# Patient Record
Sex: Male | Born: 1950
Health system: Southern US, Community
[De-identification: ages and names within clinical notes are randomized; demographics above are authoritative.]

## PROBLEM LIST (undated history)

## (undated) DIAGNOSIS — I10 Essential (primary) hypertension: Secondary | ICD-10-CM

## (undated) DIAGNOSIS — M109 Gout, unspecified: Secondary | ICD-10-CM

## (undated) DIAGNOSIS — Z973 Presence of spectacles and contact lenses: Secondary | ICD-10-CM

## (undated) DIAGNOSIS — G8929 Other chronic pain: Secondary | ICD-10-CM

## (undated) DIAGNOSIS — E785 Hyperlipidemia, unspecified: Secondary | ICD-10-CM

## (undated) DIAGNOSIS — M549 Dorsalgia, unspecified: Secondary | ICD-10-CM

## (undated) DIAGNOSIS — G629 Polyneuropathy, unspecified: Secondary | ICD-10-CM

## (undated) DIAGNOSIS — M1990 Unspecified osteoarthritis, unspecified site: Secondary | ICD-10-CM

## (undated) DIAGNOSIS — E119 Type 2 diabetes mellitus without complications: Secondary | ICD-10-CM

## (undated) DIAGNOSIS — G2581 Restless legs syndrome: Secondary | ICD-10-CM

## (undated) HISTORY — PX: TONSILLECTOMY: SUR1361

## (undated) HISTORY — PX: ELBOW ARTHROSCOPY: SUR87

## (undated) HISTORY — PX: SHOULDER ARTHROSCOPY: SHX128

## (undated) HISTORY — PX: CARPAL TUNNEL RELEASE: SHX101

## (undated) HISTORY — PX: COLONOSCOPY: SHX174

## (undated) HISTORY — PX: OTHER SURGICAL HISTORY: SHX169

---

## 2011-11-07 ENCOUNTER — Ambulatory Visit: Payer: Managed Care, Other (non HMO) | Attending: Orthopedic Surgery | Admitting: Physical Therapy

## 2011-11-07 DIAGNOSIS — M25673 Stiffness of unspecified ankle, not elsewhere classified: Secondary | ICD-10-CM | POA: Insufficient documentation

## 2011-11-07 DIAGNOSIS — M25579 Pain in unspecified ankle and joints of unspecified foot: Secondary | ICD-10-CM | POA: Insufficient documentation

## 2011-11-07 DIAGNOSIS — R5381 Other malaise: Secondary | ICD-10-CM | POA: Insufficient documentation

## 2011-11-07 DIAGNOSIS — IMO0001 Reserved for inherently not codable concepts without codable children: Secondary | ICD-10-CM | POA: Insufficient documentation

## 2011-11-07 DIAGNOSIS — M25676 Stiffness of unspecified foot, not elsewhere classified: Secondary | ICD-10-CM | POA: Insufficient documentation

## 2011-11-14 ENCOUNTER — Encounter: Payer: Managed Care, Other (non HMO) | Admitting: *Deleted

## 2011-11-26 ENCOUNTER — Ambulatory Visit: Payer: Managed Care, Other (non HMO) | Admitting: Physical Therapy

## 2012-03-09 ENCOUNTER — Encounter (HOSPITAL_BASED_OUTPATIENT_CLINIC_OR_DEPARTMENT_OTHER): Payer: Self-pay | Admitting: *Deleted

## 2012-03-10 ENCOUNTER — Encounter (HOSPITAL_BASED_OUTPATIENT_CLINIC_OR_DEPARTMENT_OTHER)
Admission: RE | Admit: 2012-03-10 | Discharge: 2012-03-10 | Disposition: A | Discharge status: Home / Self Care | Payer: Managed Care, Other (non HMO) | Source: Ambulatory Visit | Attending: Orthopedic Surgery | Admitting: Orthopedic Surgery

## 2012-03-10 LAB — BASIC METABOLIC PANEL
Calcium: 10 mg/dL (ref 8.4–10.5)
Creatinine, Ser: 0.78 mg/dL (ref 0.50–1.35)
GFR calc Af Amer: >90 mL/min (ref >90)

## 2012-03-12 ENCOUNTER — Encounter (HOSPITAL_BASED_OUTPATIENT_CLINIC_OR_DEPARTMENT_OTHER): Payer: Self-pay | Admitting: Certified Registered Nurse Anesthetist

## 2012-03-12 ENCOUNTER — Ambulatory Visit (HOSPITAL_BASED_OUTPATIENT_CLINIC_OR_DEPARTMENT_OTHER)
Admission: RE | Admit: 2012-03-12 | Discharge: 2012-03-13 | Disposition: A | Discharge status: Home / Self Care | Payer: Managed Care, Other (non HMO) | Source: Ambulatory Visit | Attending: Orthopedic Surgery | Admitting: Orthopedic Surgery

## 2012-03-12 ENCOUNTER — Ambulatory Visit (HOSPITAL_BASED_OUTPATIENT_CLINIC_OR_DEPARTMENT_OTHER): Payer: Managed Care, Other (non HMO) | Admitting: Certified Registered Nurse Anesthetist

## 2012-03-12 ENCOUNTER — Ambulatory Visit (HOSPITAL_COMMUNITY): Payer: Managed Care, Other (non HMO)

## 2012-03-12 ENCOUNTER — Encounter (HOSPITAL_BASED_OUTPATIENT_CLINIC_OR_DEPARTMENT_OTHER)
Admission: RE | Disposition: A | Discharge status: Home / Self Care | Payer: Self-pay | Source: Ambulatory Visit | Attending: Orthopedic Surgery

## 2012-03-12 ENCOUNTER — Encounter (HOSPITAL_BASED_OUTPATIENT_CLINIC_OR_DEPARTMENT_OTHER): Payer: Self-pay | Admitting: *Deleted

## 2012-03-12 DIAGNOSIS — E785 Hyperlipidemia, unspecified: Secondary | ICD-10-CM | POA: Insufficient documentation

## 2012-03-12 DIAGNOSIS — I10 Essential (primary) hypertension: Secondary | ICD-10-CM | POA: Insufficient documentation

## 2012-03-12 DIAGNOSIS — M19071 Primary osteoarthritis, right ankle and foot: Secondary | ICD-10-CM

## 2012-03-12 DIAGNOSIS — E119 Type 2 diabetes mellitus without complications: Secondary | ICD-10-CM | POA: Insufficient documentation

## 2012-03-12 DIAGNOSIS — M19079 Primary osteoarthritis, unspecified ankle and foot: Secondary | ICD-10-CM | POA: Insufficient documentation

## 2012-03-12 HISTORY — DX: Unspecified osteoarthritis, unspecified site: M19.90

## 2012-03-12 HISTORY — PX: ANKLE ARTHROSCOPY WITH ARTHRODESIS: SHX5579

## 2012-03-12 HISTORY — DX: Hyperlipidemia, unspecified: E78.5

## 2012-03-12 HISTORY — DX: Essential (primary) hypertension: I10

## 2012-03-12 HISTORY — DX: Presence of spectacles and contact lenses: Z97.3

## 2012-03-12 HISTORY — DX: Restless legs syndrome: G25.81

## 2012-03-12 HISTORY — DX: Type 2 diabetes mellitus without complications: E11.9

## 2012-03-12 HISTORY — DX: Gout, unspecified: M10.9

## 2012-03-12 LAB — GLUCOSE, CAPILLARY: Glucose-Capillary: 125 mg/dL — ABNORMAL HIGH (ref 70–99)

## 2012-03-12 SURGERY — ARTHROSCOPY, ANKLE, WITH FUSION
Anesthesia: General | Site: Ankle | Laterality: Right | Wound class: Clean

## 2012-03-12 MED ORDER — SENNOSIDES 8.6 MG PO TABS: 2.0000 | ORAL_TABLET | Freq: Every day | ORAL | Status: DC

## 2012-03-12 MED ORDER — SENNA 8.6 MG PO TABS: 1.0000 | ORAL_TABLET | Freq: Two times a day (BID) | ORAL | Status: DC

## 2012-03-12 MED ORDER — SODIUM CHLORIDE 0.9 % IV SOLN: INTRAVENOUS | Status: DC

## 2012-03-12 MED ORDER — BUPIVACAINE-EPINEPHRINE PF 0.5-1:200000 % IJ SOLN
INTRAMUSCULAR | Status: DC | PRN
  Administered 2012-03-12: 30 mL

## 2012-03-12 MED ORDER — MIDAZOLAM HCL 2 MG/2ML IJ SOLN
1.0000 mg | INTRAMUSCULAR | Status: DC | PRN
  Administered 2012-03-12: 2 mg via INTRAVENOUS

## 2012-03-12 MED ORDER — METFORMIN HCL ER 500 MG PO TB24: 500.0000 mg | ORAL_TABLET | Freq: Every evening | ORAL | Status: DC

## 2012-03-12 MED ORDER — ONDANSETRON HCL 4 MG/2ML IJ SOLN: 4.0000 mg | Freq: Four times a day (QID) | INTRAMUSCULAR | Status: DC | PRN

## 2012-03-12 MED ORDER — METHOCARBAMOL 500 MG PO TABS
500.0000 mg | ORAL_TABLET | Freq: Four times a day (QID) | ORAL | Status: DC | PRN
  Administered 2012-03-12 (×2): 500 mg via ORAL

## 2012-03-12 MED ORDER — METOCLOPRAMIDE HCL 5 MG PO TABS: 5.0000 mg | ORAL_TABLET | Freq: Three times a day (TID) | ORAL | Status: DC | PRN

## 2012-03-12 MED ORDER — ONDANSETRON HCL 4 MG/2ML IJ SOLN: 4.0000 mg | Freq: Once | INTRAMUSCULAR | Status: DC | PRN

## 2012-03-12 MED ORDER — CEFAZOLIN SODIUM-DEXTROSE 2-3 GM-% IV SOLR
2.0000 g | INTRAVENOUS | Status: AC
  Administered 2012-03-12: 2 g via INTRAVENOUS

## 2012-03-12 MED ORDER — PROPOFOL 10 MG/ML IV BOLUS
INTRAVENOUS | Status: DC | PRN
  Administered 2012-03-12: 200 mg via INTRAVENOUS

## 2012-03-12 MED ORDER — OXYCODONE HCL 5 MG PO TABS
5.0000 mg | ORAL_TABLET | ORAL | Status: DC | PRN
  Administered 2012-03-12 – 2012-03-13 (×4): 10 mg via ORAL

## 2012-03-12 MED ORDER — OXYCODONE HCL 5 MG/5ML PO SOLN: 5.0000 mg | Freq: Once | ORAL | Status: DC | PRN

## 2012-03-12 MED ORDER — METOCLOPRAMIDE HCL 5 MG/ML IJ SOLN: 5.0000 mg | Freq: Three times a day (TID) | INTRAMUSCULAR | Status: DC | PRN

## 2012-03-12 MED ORDER — CHLORHEXIDINE GLUCONATE 4 % EX LIQD
60.0000 mL | Freq: Once | CUTANEOUS | Status: AC
  Administered 2012-03-12: 4 via TOPICAL

## 2012-03-12 MED ORDER — SODIUM CHLORIDE 0.9 % IR SOLN
Status: DC | PRN
  Administered 2012-03-12: 12000 mL

## 2012-03-12 MED ORDER — OXYCODONE HCL 5 MG PO TABS: 5.0000 mg | ORAL_TABLET | Freq: Once | ORAL | Status: DC | PRN

## 2012-03-12 MED ORDER — MORPHINE SULFATE 2 MG/ML IJ SOLN: 1.0000 mg | INTRAMUSCULAR | Status: DC | PRN

## 2012-03-12 MED ORDER — DEXAMETHASONE SODIUM PHOSPHATE 10 MG/ML IJ SOLN
INTRAMUSCULAR | Status: DC | PRN
  Administered 2012-03-12: 4 mg via INTRAVENOUS

## 2012-03-12 MED ORDER — ATORVASTATIN CALCIUM 40 MG PO TABS: 40.0000 mg | ORAL_TABLET | Freq: Every evening | ORAL | Status: DC

## 2012-03-12 MED ORDER — INSULIN ASPART 100 UNIT/ML ~~LOC~~ SOLN: 0.0000 [IU] | Freq: Three times a day (TID) | SUBCUTANEOUS | Status: DC

## 2012-03-12 MED ORDER — DOCUSATE SODIUM 100 MG PO CAPS: 100.0000 mg | ORAL_CAPSULE | Freq: Two times a day (BID) | ORAL | Status: DC

## 2012-03-12 MED ORDER — PRAMIPEXOLE DIHYDROCHLORIDE 0.25 MG PO TABS: 0.5000 mg | ORAL_TABLET | Freq: Every evening | ORAL | Status: DC

## 2012-03-12 MED ORDER — ASPIRIN EC 325 MG PO TBEC: 325.0000 mg | DELAYED_RELEASE_TABLET | Freq: Every day | ORAL | Status: DC

## 2012-03-12 MED ORDER — LISINOPRIL 20 MG PO TABS: 20.0000 mg | ORAL_TABLET | Freq: Every evening | ORAL | Status: DC

## 2012-03-12 MED ORDER — SODIUM CHLORIDE 0.9 % IV SOLN
INTRAVENOUS | Status: DC
  Administered 2012-03-12: 125 mL/h via INTRAVENOUS

## 2012-03-12 MED ORDER — LACTATED RINGERS IV SOLN
INTRAVENOUS | Status: DC
  Administered 2012-03-12 (×3): via INTRAVENOUS

## 2012-03-12 MED ORDER — MIDAZOLAM HCL 5 MG/5ML IJ SOLN
INTRAMUSCULAR | Status: DC | PRN
  Administered 2012-03-12: 1 mg via INTRAVENOUS

## 2012-03-12 MED ORDER — ONDANSETRON HCL 4 MG/2ML IJ SOLN
INTRAMUSCULAR | Status: DC | PRN
  Administered 2012-03-12: 4 mg via INTRAVENOUS

## 2012-03-12 MED ORDER — HYDROMORPHONE HCL PF 1 MG/ML IJ SOLN: 0.2500 mg | INTRAMUSCULAR | Status: DC | PRN

## 2012-03-12 MED ORDER — METHOCARBAMOL 100 MG/ML IJ SOLN: 500.0000 mg | Freq: Four times a day (QID) | INTRAVENOUS | Status: DC | PRN

## 2012-03-12 MED ORDER — FENTANYL CITRATE 0.05 MG/ML IJ SOLN
50.0000 ug | INTRAMUSCULAR | Status: DC | PRN
  Administered 2012-03-12: 100 ug via INTRAVENOUS

## 2012-03-12 MED ORDER — ROPIVACAINE HCL 5 MG/ML IJ SOLN
INTRAMUSCULAR | Status: DC | PRN
  Administered 2012-03-12: 10 mL via EPIDURAL

## 2012-03-12 MED ORDER — ALLOPURINOL 300 MG PO TABS: 300.0000 mg | ORAL_TABLET | Freq: Every evening | ORAL | Status: DC

## 2012-03-12 MED ORDER — CEFAZOLIN SODIUM-DEXTROSE 2-3 GM-% IV SOLR
2.0000 g | Freq: Four times a day (QID) | INTRAVENOUS | Status: AC
  Administered 2012-03-12 – 2012-03-13 (×3): 2 g via INTRAVENOUS

## 2012-03-12 MED ORDER — FENTANYL CITRATE 0.05 MG/ML IJ SOLN
INTRAMUSCULAR | Status: DC | PRN
  Administered 2012-03-12: 25 ug via INTRAVENOUS

## 2012-03-12 MED ORDER — DIPHENHYDRAMINE HCL 12.5 MG/5ML PO ELIX: 12.5000 mg | ORAL_SOLUTION | ORAL | Status: DC | PRN

## 2012-03-12 MED ORDER — ONDANSETRON HCL 4 MG PO TABS: 4.0000 mg | ORAL_TABLET | Freq: Four times a day (QID) | ORAL | Status: DC | PRN

## 2012-03-12 MED ORDER — OXYCODONE HCL 5 MG PO TABS: 5.0000 mg | ORAL_TABLET | ORAL | Status: DC | PRN

## 2012-03-12 MED ORDER — LIDOCAINE HCL (CARDIAC) 20 MG/ML IV SOLN
INTRAVENOUS | Status: DC | PRN
  Administered 2012-03-12: 60 mg via INTRAVENOUS

## 2012-03-12 SURGICAL SUPPLY — 86 items
BANDAGE ESMARK 6X9 LF (GAUZE/BANDAGES/DRESSINGS) ×1 IMPLANT
BIT DRILL 5 ACE CANN QC (BIT) ×2 IMPLANT
BLADE CUDA 2.0 (BLADE) IMPLANT
BLADE CUDA SHAVER 3.5 (BLADE) ×2 IMPLANT
BLADE GREAT WHITE 4.2 (BLADE) ×2 IMPLANT
BLADE SURG 15 STRL LF DISP TIS (BLADE) ×2 IMPLANT
BLADE SURG 15 STRL SS (BLADE) ×2
BMA 60 COLLECTION KIT ×2 IMPLANT
BNDG COHESIVE 4X5 TAN STRL (GAUZE/BANDAGES/DRESSINGS) ×2 IMPLANT
BNDG COHESIVE 6X5 TAN STRL LF (GAUZE/BANDAGES/DRESSINGS) ×2 IMPLANT
BNDG ESMARK 6X9 LF (GAUZE/BANDAGES/DRESSINGS) ×2
BUR 3.5 LG SPHERICAL (BURR) ×1 IMPLANT
BUR CUDA 2.9 (BURR) IMPLANT
BUR FULL RADIUS 2.9 (BURR) IMPLANT
BUR GATOR 2.9 (BURR) IMPLANT
BUR OVAL 4.0 (BURR) IMPLANT
BUR SPHERICAL 2.9 (BURR) IMPLANT
BUR VERTEX HOODED 4.5 (BURR) IMPLANT
BURR 3.5 LG SPHERICAL (BURR) ×2
CANISTER OMNI JUG 16 LITER (MISCELLANEOUS) ×2 IMPLANT
CANISTER SUCTION 2500CC (MISCELLANEOUS) IMPLANT
CHLORAPREP W/TINT 26ML (MISCELLANEOUS) ×4 IMPLANT
CLOTH BEACON ORANGE TIMEOUT ST (SAFETY) ×2 IMPLANT
CUFF TOURNIQUET SINGLE 34IN LL (TOURNIQUET CUFF) ×2 IMPLANT
DRAPE ARTHROSCOPY W/POUCH 114 (DRAPES) ×2 IMPLANT
DRAPE C-ARM 42X72 X-RAY (DRAPES) ×2 IMPLANT
DRAPE INCISE IOBAN 66X45 STRL (DRAPES) ×4 IMPLANT
DRAPE U-SHAPE 47X51 STRL (DRAPES) ×2 IMPLANT
DRAPE U-SHAPE 76X120 STRL (DRAPES) ×2 IMPLANT
DRSG EMULSION OIL 3X3 NADH (GAUZE/BANDAGES/DRESSINGS) ×2 IMPLANT
DRSG TEGADERM 2-3/8X2-3/4 SM (GAUZE/BANDAGES/DRESSINGS) ×2 IMPLANT
DURA STEPPER LG (CAST SUPPLIES) IMPLANT
DURA STEPPER MED (CAST SUPPLIES) IMPLANT
DURA STEPPER SML (CAST SUPPLIES) IMPLANT
ELECT REM PT RETURN 9FT ADLT (ELECTROSURGICAL) ×2
ELECTRODE REM PT RTRN 9FT ADLT (ELECTROSURGICAL) ×1 IMPLANT
GLOVE BIO SURGEON STRL SZ8 (GLOVE) ×6 IMPLANT
GLOVE BIOGEL M STRL SZ7.5 (GLOVE) ×2 IMPLANT
GLOVE BIOGEL PI IND STRL 8 (GLOVE) ×2 IMPLANT
GLOVE BIOGEL PI INDICATOR 8 (GLOVE) ×2
GLOVE ECLIPSE 6.5 STRL STRAW (GLOVE) ×4 IMPLANT
GLOVE INDICATOR 6.5 STRL GRN (GLOVE) ×2 IMPLANT
GOWN PREVENTION PLUS XLARGE (GOWN DISPOSABLE) ×6 IMPLANT
GOWN PREVENTION PLUS XXLARGE (GOWN DISPOSABLE) ×4 IMPLANT
HEPARIN 1000 UNITS - ERX # 10176 ×8 IMPLANT
NDL SAFETY ECLIPSE 18X1.5 (NEEDLE) ×1 IMPLANT
NEEDLE HYPO 18GX1.5 SHARP (NEEDLE) ×1
NS IRRIG 1000ML POUR BTL (IV SOLUTION) ×2 IMPLANT
PACK ARTHROSCOPY DSU (CUSTOM PROCEDURE TRAY) ×2 IMPLANT
PACK BASIN DAY SURGERY FS (CUSTOM PROCEDURE TRAY) ×2 IMPLANT
PAD CAST 4YDX4 CTTN HI CHSV (CAST SUPPLIES) ×1 IMPLANT
PADDING CAST ABS 4INX4YD NS (CAST SUPPLIES) ×1
PADDING CAST ABS COTTON 4X4 ST (CAST SUPPLIES) ×1 IMPLANT
PADDING CAST COTTON 4X4 STRL (CAST SUPPLIES) ×1
PADDING CAST COTTON 6X4 STRL (CAST SUPPLIES) ×2 IMPLANT
PENCIL BUTTON HOLSTER BLD 10FT (ELECTRODE) IMPLANT
PIN THREADED GUIDE ACE (PIN) ×6 IMPLANT
SCREW CANN 6.5 40MM (Screw) ×1 IMPLANT
SCREW CANN 6.5 55MM (Screw) ×2 IMPLANT
SCREW CANN 6.5 60MM (Screw) ×2 IMPLANT
SCREW CANN 6.5 CUT FLUT 40X22X (Screw) ×1 IMPLANT
SCREW CANN 8.0 55MM (Screw) ×2 IMPLANT
SET IRRIG Y TYPE TUR BLADDER L (SET/KITS/TRAYS/PACK) ×2 IMPLANT
SLEEVE SCD COMPRESS KNEE MED (MISCELLANEOUS) ×2 IMPLANT
SPLINT FAST PLASTER 5X30 (CAST SUPPLIES) ×20
SPLINT PLASTER CAST FAST 5X30 (CAST SUPPLIES) ×20 IMPLANT
SPONGE GAUZE 2X2 8PLY STRL LF (GAUZE/BANDAGES/DRESSINGS) ×2 IMPLANT
SPONGE GAUZE 4X4 12PLY (GAUZE/BANDAGES/DRESSINGS) ×2 IMPLANT
SPONGE LAP 18X18 X RAY DECT (DISPOSABLE) ×2 IMPLANT
STOCKINETTE 6  STRL (DRAPES)
STOCKINETTE 6 STRL (DRAPES) IMPLANT
STRAP ANKLE FOOT DISTRACTOR (ORTHOPEDIC SUPPLIES) ×2 IMPLANT
STRIP CLOSURE SKIN 1/2X4 (GAUZE/BANDAGES/DRESSINGS) ×2 IMPLANT
SUCTION FRAZIER TIP 10 FR DISP (SUCTIONS) IMPLANT
SUT ETHILON 3 0 PS 1 (SUTURE) ×2 IMPLANT
SUT MNCRL AB 3-0 PS2 18 (SUTURE) IMPLANT
SUT VIC AB 0 CT1 27 (SUTURE)
SUT VIC AB 0 CT1 27XBRD ANBCTR (SUTURE) IMPLANT
SUT VIC AB 2-0 SH 18 (SUTURE) IMPLANT
SUT VIC AB 3-0 PS1 18 (SUTURE)
SUT VIC AB 3-0 PS1 18XBRD (SUTURE) IMPLANT
SYR BULB 3OZ (MISCELLANEOUS) IMPLANT
TOWEL OR 17X24 6PK STRL BLUE (TOWEL DISPOSABLE) ×4 IMPLANT
TUBE CONNECTING 20X1/4 (TUBING) ×4 IMPLANT
WASHER ACECAN 6.5 (Washer) ×2 IMPLANT
WATER STERILE IRR 1000ML POUR (IV SOLUTION) IMPLANT

## 2012-03-12 NOTE — Brief Op Note (Signed)
03/12/2012  11:27 AM  PATIENT:  Kirk Brown  61 y.o. male  PRE-OPERATIVE DIAGNOSIS:  Right ankle arthritis  POST-OPERATIVE DIAGNOSIS:  Right ankle arthritis  Procedure(s): 1.  Right ankle arthroscopic arthrodesis 2.  Aspiration of bone marrow from the right iliac crest 3.  Fluoro > 1 hour  SURGEON:  Toni Arthurs, MD  ASSISTANT: n/a  ANESTHESIA:   General, regional  EBL:  minimal   TOURNIQUET:   Total Tourniquet Time Documented: Thigh (Right) - 111 minutes  COMPLICATIONS:  None apparent  DISPOSITION:  Extubated, awake and stable to recovery.  DICTATION ID:   [id number]

## 2012-03-12 NOTE — Progress Notes (Signed)
Assisted Dr. Crews with right, ultrasound guided, popliteal/saphenous block. Side rails up, monitors on throughout procedure. See vital signs in flow sheet. Tolerated Procedure well. 

## 2012-03-12 NOTE — H&P (Signed)
Kirk Brown is an 61 y.o. male.   Chief Complaint: right ankle pain HPI: 61 y/o male with right ankle pain for several years.  Xrays show end stage arthritis.  He has failed treatment with bracing, activity modification, antiinflammatory meds and pain meds.  He presents now for ankle arthrodesis.  Past Medical History  Diagnosis Date  . Gout   . Hypertension   . Hyperlipemia   . RLS (restless legs syndrome)   . Diabetes mellitus without complication   . Wears glasses   . Arthritis     Past Surgical History  Procedure Date  . Colonoscopy   . Carpal tunnel release     both hands  . Tonsillectomy   . Shoulder arthroscopy     lt  . Elbow arthroscopy     both    History reviewed. No pertinent family history. Social History:  reports that he quit smoking about 23 years ago. He does not have any smokeless tobacco history on file. He reports that he drinks alcohol. His drug history not on file.  Allergies: No Known Allergies  Medications Prior to Admission  Medication Sig Dispense Refill  . allopurinol (ZYLOPRIM) 300 MG tablet Take 300 mg by mouth every evening.      Marland Kitchen aspirin 81 MG tablet Take 81 mg by mouth daily.      Marland Kitchen atorvastatin (LIPITOR) 40 MG tablet Take 40 mg by mouth every evening.      Marland Kitchen lisinopril (PRINIVIL,ZESTRIL) 20 MG tablet Take 20 mg by mouth every evening.      . metformin (FORTAMET) 500 MG (OSM) 24 hr tablet Take 500 mg by mouth every evening.      . pramipexole (MIRAPEX) 0.5 MG tablet Take 0.5 mg by mouth every evening.      . indomethacin (INDOCIN) 25 MG capsule Take 25 mg by mouth as needed.        Results for orders placed during the hospital encounter of 03/12/12 (from the past 48 hour(s))  BASIC METABOLIC PANEL     Status: Abnormal   Collection Time   03/10/12 10:20 AM      Component Value Range Comment   Sodium 140  135 - 145 mEq/L    Potassium 4.3  3.5 - 5.1 mEq/L    Chloride 102  96 - 112 mEq/L    CO2 27  19 - 32 mEq/L    Glucose, Bld 143  (*) 70 - 99 mg/dL    BUN 19  6 - 23 mg/dL    Creatinine, Ser 1.61  0.50 - 1.35 mg/dL    Calcium 09.6  8.4 - 10.5 mg/dL    GFR calc non Af Amer >90  >90 mL/min    GFR calc Af Amer >90  >90 mL/min   GLUCOSE, CAPILLARY     Status: Abnormal   Collection Time   03/12/12  6:51 AM      Component Value Range Comment   Glucose-Capillary 125 (*) 70 - 99 mg/dL    No results found.  ROS  No recent f/c/n/v/wt loss.  Blood pressure 113/72, pulse 74, temperature 98 F (36.7 C), temperature source Oral, resp. rate 18, height 6' (1.829 m), weight 103.874 kg (229 lb), SpO2 97.00%. Physical Exam  wn wd male in nad.  A and O x 4.  Mood and affect normal.  EOMI.  Respirations unlabored.  R ankle with swelling and decreased ROM.  Skin intact and healthy.  5/5 strength in PF and DF of  the ankle.  sens to LT intact about the ankle.  Assessment/Plan Right ankle arthritis - to OR for arthroscopic arthrodesis of the ankle.  The risks and benefits of the alternative treatment options have been discussed in detail.  The patient wishes to proceed with surgery and specifically understands risks of bleeding, infection, nerve damage, blood clots, need for additional surgery, amputation and death.   Toni Arthurs 03/31/12, 7:14 AM

## 2012-03-12 NOTE — Transfer of Care (Signed)
Immediate Anesthesia Transfer of Care Note  Patient: Kirk Brown  Procedure(s) Performed: Procedure(s) (LRB) with comments: ANKLE ARTHROSCOPY WITH ARTHRODESIS (Right) - Right Ankle Arthroscopy with Arthrodesis and  Bone Marrow Aspiration Right Hip [facility])  Patient Location: PACU  Anesthesia Type:General and Regional  Level of Consciousness: awake  Airway & Oxygen Therapy: Patient Spontanous Breathing and Patient connected to face mask oxygen  Post-op Assessment: Report given to PACU RN and Post -op Vital signs reviewed and stable  Post vital signs: Reviewed and stable  Complications: No apparent anesthesia complications

## 2012-03-12 NOTE — Anesthesia Preprocedure Evaluation (Addendum)
Anesthesia Evaluation  Patient identified by MRN, date of birth, ID band Patient awake    Reviewed: Allergy & Precautions, H&P , NPO status , Patient's Chart, lab work & pertinent test results  Airway Mallampati: I TM Distance: >3 FB Neck ROM: Full    Dental  (+) Teeth Intact and Dental Advisory Given   Pulmonary  breath sounds clear to auscultation        Cardiovascular hypertension, Pt. on medications Rhythm:Regular Rate:Normal     Neuro/Psych    GI/Hepatic   Endo/Other  diabetes, Well Controlled, Type 2, Oral Hypoglycemic Agents  Renal/GU      Musculoskeletal   Abdominal   Peds  Hematology   Anesthesia Other Findings   Reproductive/Obstetrics                          Anesthesia Physical Anesthesia Plan  ASA: III  Anesthesia Plan: General   Post-op Pain Management:    Induction: Intravenous  Airway Management Planned:   Additional Equipment:   Intra-op Plan:   Post-operative Plan: Extubation in OR  Informed Consent: I have reviewed the patients History and Physical, chart, labs and discussed the procedure including the risks, benefits and alternatives for the proposed anesthesia with the patient or authorized representative who has indicated his/her understanding and acceptance.   Dental advisory given  Plan Discussed with: CRNA, Anesthesiologist and Surgeon  Anesthesia Plan Comments:         Anesthesia Quick Evaluation

## 2012-03-12 NOTE — Anesthesia Procedure Notes (Addendum)
Anesthesia Regional Block:  Popliteal block  Pre-Anesthetic Checklist: ,, timeout performed, Correct Patient, Correct Site, Correct Laterality, Correct Procedure, Correct Position, site marked, Risks and benefits discussed,  Surgical consent,  Pre-op evaluation,  At surgeon's request and post-op pain management  Laterality: Right and Lower  Prep: chloraprep       Needles:  Injection technique: Single-shot  Needle Type: Echogenic Needle     Needle Length: 9cm  Needle Gauge: 21    Additional Needles:  Procedures: ultrasound guided (picture in chart) Popliteal block Narrative:  Start time: 03/12/2012 7:15 AM End time: 03/12/2012 7:25 AM Injection made incrementally with aspirations every 5 mL.  Performed by: Personally  Anesthesiologist: Sheldon Silvan, MD  Additional Notes: Marcaine 0.5% with EPI 1:200000 and Naropin 0.5%.  In addition, a Right saphenous block was performed with US guidance.  Femoral nerve block Procedure Name: LMA Insertion Date/Time: 03/12/2012 7:44 AM Performed by: Darol Cush D Pre-anesthesia Checklist: Patient identified, Emergency Drugs available, Suction available and Patient being monitored Patient Re-evaluated:Patient Re-evaluated prior to inductionOxygen Delivery Method: Circle System Utilized Preoxygenation: Pre-oxygenation with 100% oxygen Intubation Type: IV induction Ventilation: Mask ventilation without difficulty LMA: LMA inserted LMA Size: 5.0 Number of attempts: 1 Placement Confirmation: positive ETCO2 Tube secured with: Tape Dental Injury: Teeth and Oropharynx as per pre-operative assessment

## 2012-03-12 NOTE — Anesthesia Postprocedure Evaluation (Signed)
  Anesthesia Post-op Note  Patient: Kirk Brown  Procedure(s) Performed: Procedure(s) (LRB) with comments: ANKLE ARTHROSCOPY WITH ARTHRODESIS (Right) - Right Ankle Arthroscopy with Arthrodesis and  Bone Marrow Aspiration Right Hip [facility])  Patient Location: PACU  Anesthesia Type:GA combined with regional for post-op pain  Level of Consciousness: awake, alert  and oriented  Airway and Oxygen Therapy: Patient Spontanous Breathing  Post-op Pain: none  Post-op Assessment: Post-op Vital signs reviewed  Post-op Vital Signs: Reviewed  Complications: No apparent anesthesia complications

## 2012-03-13 NOTE — Op Note (Signed)
NAMEHARWOOD, NALL NO.:  [id number]  MEDICAL RECORD NO.:  [id number]  LOCATION:                                 FACILITY:  PHYSICIAN:  Toni Arthurs, MD        DATE OF BIRTH:  12-25-1950  DATE OF PROCEDURE:  03/12/2012 DATE OF DISCHARGE:                              OPERATIVE REPORT   PREOPERATIVE DIAGNOSIS:  Right ankle arthritis.  POSTOP DIAGNOSIS:  Right ankle arthritis.  PROCEDURE: 1. Right ankle arthroscopic arthrodesis. 2. Aspiration of bone marrow from the right iliac crest. 3. Intraoperative interpretation of fluoroscopic imaging greater than     1 hour.  SURGEON:  Toni Arthurs, MD  ANESTHESIA:  General, regional.  ESTIMATED BLOOD LOSS:  Minimal.  TOURNIQUET TIME:  111 minutes at 225 mmHg.  COMPLICATIONS:  None apparent.  DISPOSITION:  Extubated awake and stable to recovery.  INDICATIONS FOR PROCEDURE:  The patient is a 61 year old male with past medical history significant for type 2 diabetes.  He has end-stage right ankle arthritis.  He has failed treatment with activity modification, oral anti-inflammatories, bracing, and shoe wear modification.  He desires surgical correction and presents now for arthroscopic ankle arthrodesis.  He understands the risks and benefits, the alternative treatment options, and elected surgical treatment.  He specifically understands risks of bleeding, infection, nerve damage, blood clots, need for additional surgery, amputation, and death.  PROCEDURE IN DETAIL:  After preoperative consent was obtained and the correct operative site was identified, the patient was brought to the operating room and placed supine on the operating table.  General anesthesia was induced.  Preoperative antibiotics were administered. Surgical time-out was taken.  The right hip was prepped in standard sterile fashion.  A surgical time-out was taken.  A small stab incision was made over the iliac crest.  A trocar was inserted down  through the subcutaneous tissue.  A mallet was used to pass the sharp end of the trocar through the superior aspect of the iliac crest.  The sharp trocar was exchanged for a blunt trocar and this was advanced into the medullary space.  Approximately 30 mL of bone marrow was aspirated from the iliac crest.  The marrow was then passed off the field to the centrifuge where it was spun down to create approximately 7 mL of bone marrow aspirate to concentrate.  This was placed on the back table and held until later in the case.  The hip wound was closed with Steri- Strips and a dry dressing was applied.  Attention was then turned to the right lower extremity where it was prepped and draped in standard sterile fashion and positioned in a thigh holder.  The lower extremity was exsanguinated and tourniquet was inflated to 225 mmHg.  Noninvasive distraction strap was placed on the foot and the ankle was distracted.  An anteromedial arthroscopy portal was established.  The arthroscope was inserted into the ankle joint.  An anterolateral arthroscopy portal was established under direct vision. The shaver was inserted and used to clean out the synovitis anteriorly. There was full-thickness cartilage loss over the entire joint space with small bits of cartilage remaining, particularly in the posterior aspect  of the joint.  Arthroscopic shaver, curettes, and graspers were used to remove all of the cartilage in the joint and posterolateral arthroscopy portal was established and used to remove the cartilage from the posterior aspect of the joint.  Both gutters were cleaned as well.  Once all the cartilage was removed, a hooded bur was inserted and used to remove the subchondral bone over the superior aspect of the talar dome and the tibial plafond.  Microfracture awls was introduced into the joint and used to perforate the subchondral bone in multiple locations over the entire joint surface.  At this  point, the arthroscopic fluid was turned off and appropriate bleeding was noted throughout the ankle joint.  All the arthroscopic instruments were then removed.  The bone marrow aspirate was inserted into the joint and injected.  Both arthroscopy portals were then closed with horizontal mattress sutures of 3-0 nylon.  The ankle joint was reduced.  Guide pins were introduced percutaneously from the anteromedial and anterolateral distal tibia across the joint into the talus.  A third guide pin was introduced posterolaterally on the talus posterior to the fibula across the joint and into the talus.  AP and lateral fluoroscopic images confirmed appropriate position of the 3 guide pins.  The posterior guide pin was drilled and a 6.5 mm cannulated partially-threaded screw was inserted. It was noted to have excellent purchase.  Screw was then selected and again a 6.5 mm screw was inserted.  This was noted to not have particularly good purchase, so it was exchanged for an 8 mm screw which was noted to have excellent purchase.  The anteromedial pin was then utilized to insert a 3rd screw.  This was a 6.5 mm partially threaded screw with a washer.  It was also noted to have excellent purchase. Final AP and lateral views showed appropriate reduction of the ankle joint.  Appropriate position and length of all the screws.  All 3 guide pins were removed.  Wounds were irrigated copiously.  Horizontal mattress sutures of 3-0 nylon were used to close all of the incisions. Sterile dressings were applied followed by a well-padded short-leg cast. The tourniquet had been released at 111 minutes.  The patient was then awakened from anesthesia and transported to recovery room in stable condition.  FOLLOWUP PLAN:  The patient may be observed overnight for pain control. He will be nonweightbearing on the right lower extremity.  He will follow up with me in 2 weeks for suture removal and conversion to  a cast.     Toni Arthurs, MD     JH/MEDQ  D:  03/12/2012  T:  03/13/2012  Job:  [id number]

## 2012-03-16 ENCOUNTER — Encounter (HOSPITAL_BASED_OUTPATIENT_CLINIC_OR_DEPARTMENT_OTHER): Payer: Self-pay | Admitting: Orthopedic Surgery

## 2012-03-17 ENCOUNTER — Encounter (HOSPITAL_BASED_OUTPATIENT_CLINIC_OR_DEPARTMENT_OTHER): Payer: Self-pay

## 2012-09-30 ENCOUNTER — Ambulatory Visit (INDEPENDENT_AMBULATORY_CARE_PROVIDER_SITE_OTHER): Payer: Managed Care, Other (non HMO)

## 2012-09-30 ENCOUNTER — Encounter: Payer: Self-pay | Admitting: Physician Assistant

## 2012-09-30 ENCOUNTER — Ambulatory Visit (INDEPENDENT_AMBULATORY_CARE_PROVIDER_SITE_OTHER): Payer: Managed Care, Other (non HMO) | Admitting: Physician Assistant

## 2012-09-30 VITALS — BP 164/99 | HR 73 | Temp 97.8°F | Ht 71.5 in | Wt 240.0 lb

## 2012-09-30 DIAGNOSIS — R1032 Left lower quadrant pain: Secondary | ICD-10-CM

## 2012-09-30 DIAGNOSIS — E785 Hyperlipidemia, unspecified: Secondary | ICD-10-CM

## 2012-09-30 DIAGNOSIS — M549 Dorsalgia, unspecified: Secondary | ICD-10-CM

## 2012-09-30 DIAGNOSIS — I10 Essential (primary) hypertension: Secondary | ICD-10-CM

## 2012-09-30 DIAGNOSIS — R319 Hematuria, unspecified: Secondary | ICD-10-CM

## 2012-09-30 DIAGNOSIS — M109 Gout, unspecified: Secondary | ICD-10-CM

## 2012-09-30 LAB — POCT UA - MICROSCOPIC ONLY
Casts, Ur, LPF, POC: NEGATIVE
Crystals, Ur, HPF, POC: NEGATIVE

## 2012-09-30 LAB — POCT URINALYSIS DIPSTICK
Glucose, UA: NEGATIVE
Leukocytes, UA: NEGATIVE
Nitrite, UA: NEGATIVE
Urobilinogen, UA: NEGATIVE
pH, UA: 5

## 2012-09-30 MED ORDER — TRAMADOL HCL 50 MG PO TABS: 50.0000 mg | ORAL_TABLET | Freq: Three times a day (TID) | ORAL | Status: DC | PRN

## 2012-09-30 NOTE — Progress Notes (Signed)
Subjective:     Patient ID: Kirk Brown, male   DOB: 1950/11/10, 62 y.o.   MRN: [id number]  HPI Pt with progressive LLQ abd pain that radiates to the L -spine area He denies any trauma He has had some sx for several years but they have become quite bad over the last week Pt with nl colonoscopy per him in the last 5 yrs There has been some recent constipation issues due to pain med from dental procedure No dysuria/polyuria + FH for renal stones  Review of Systems  All other systems reviewed and are negative.       Objective:   Physical Exam  Nursing note and vitals reviewed.  Heart- RRR w/o M Lungs- CTA Abd- soft, sl TTP LLQ, no masses/HSM, + umb hernia UA- see labs KUB- no abnl, will be over-read    Assessment:     1. Back pain   2. Abdominal pain, left lower quadrant   3. Hematuria   4. Gout   5. HTN (hypertension)   6. Other and unspecified hyperlipidemia        Plan:     Will set up imaging studies to r/o stones Hydrate Minimize caff Ultram rx F/U pending labs

## 2012-09-30 NOTE — Patient Instructions (Signed)
Hematuria, Adult Hematuria (blood in your urine) can be caused by a bladder infection (cystitis), kidney infection (pyelonephritis), prostate infection (prostatitis), or kidney stone. Infections will usually respond to antibiotics (medications which kill germs), and a kidney stone will usually pass through your urine without further treatment. If you were put on antibiotics, take all the medicine until gone. You may feel better in a few days, but take all of your medicine or the infection may not respond and become more difficult to treat. If antibiotics were not given, an infection did not cause the blood in the urine. A further work up to find out the reason may be needed. HOME CARE INSTRUCTIONS   Drink lots of fluid, 3 to 4 quarts a day. If you have been diagnosed with an infection, cranberry juice is especially recommended, in addition to large amounts of water.  Avoid caffeine, tea, and carbonated beverages, because they tend to irritate the bladder.  Avoid alcohol as it may irritate the prostate.  Only take over-the-counter or prescription medicines for pain, discomfort, or fever as directed by your caregiver.  If you have been diagnosed with a kidney stone follow your caregivers instructions regarding straining your urine to catch the stone. TO PREVENT FURTHER INFECTIONS:  Empty the bladder often. Avoid holding urine for long periods of time.  After a bowel movement, women should cleanse front to back. Use each tissue only once.  Empty the bladder before and after sexual intercourse if you are a male.  Return to your caregiver if you develop back pain, fever, nausea (feeling sick to your stomach), vomiting, or your symptoms (problems) are not better in 3 days. Return sooner if you are getting worse. If you have been requested to return for further testing make sure to keep your appointments. If an infection is not the cause of blood in your urine, X-rays may be required. Your caregiver  will discuss this with you. SEEK IMMEDIATE MEDICAL CARE IF:   You have a persistent fever over 102 F (38.9 C).  You develop severe vomiting and are unable to keep the medication down.  You develop severe back or abdominal pain despite taking your medications.  You begin passing a large amount of blood or clots in your urine.  You feel extremely weak or faint, or pass out. MAKE SURE YOU:   Understand these instructions.  Will watch your condition.  Will get help right away if you are not doing well or get worse. Document Released: 04/15/2005 Document Revised: 07/08/2011 Document Reviewed: 12/03/2007 Exit[employer] Patient Information 2014 [employer].  

## 2012-10-01 NOTE — Addendum Note (Signed)
Addended by: Gwenith Daily on: 10/01/2012 01:42 PM   Modules accepted: Orders

## 2012-10-05 ENCOUNTER — Ambulatory Visit (INDEPENDENT_AMBULATORY_CARE_PROVIDER_SITE_OTHER): Payer: Managed Care, Other (non HMO) | Admitting: Family Medicine

## 2012-10-05 ENCOUNTER — Ambulatory Visit
Admission: RE | Admit: 2012-10-05 | Discharge: 2012-10-05 | Disposition: A | Discharge status: Home / Self Care | Payer: Managed Care, Other (non HMO) | Source: Ambulatory Visit | Attending: Family Medicine | Admitting: Family Medicine

## 2012-10-05 VITALS — BP 122/60 | HR 78 | Temp 97.8°F | Ht 71.0 in | Wt 234.0 lb

## 2012-10-05 DIAGNOSIS — I10 Essential (primary) hypertension: Secondary | ICD-10-CM

## 2012-10-05 DIAGNOSIS — M109 Gout, unspecified: Secondary | ICD-10-CM

## 2012-10-05 DIAGNOSIS — E785 Hyperlipidemia, unspecified: Secondary | ICD-10-CM

## 2012-10-05 DIAGNOSIS — R1032 Left lower quadrant pain: Secondary | ICD-10-CM

## 2012-10-05 LAB — COMPLETE METABOLIC PANEL WITH GFR
ALT: 26 U/L (ref 0–53)
AST: 19 U/L (ref 0–37)
Albumin: 4.4 g/dL (ref 3.5–5.2)
Alkaline Phosphatase: 49 U/L (ref 39–117)
BUN: 18 mg/dL (ref 6–23)
CO2: 28 mEq/L (ref 19–32)
Calcium: 10.2 mg/dL (ref 8.4–10.5)
Chloride: 101 mEq/L (ref 96–112)
Creat: 0.89 mg/dL (ref 0.50–1.35)
GFR, Est African American: >89 mL/min
GFR, Est Non African American: >89 mL/min
Glucose, Bld: 161 mg/dL — ABNORMAL HIGH (ref 70–99)
Potassium: 4.8 mEq/L (ref 3.5–5.3)
Sodium: 139 mEq/L (ref 135–145)
Total Bilirubin: 0.5 mg/dL (ref 0.3–1.2)
Total Protein: 7.5 g/dL (ref 6.0–8.3)

## 2012-10-05 LAB — POCT CBC
Granulocyte percent: 68.8 % (ref 37–80)
HCT, POC: 44.1 % (ref 43.5–53.7)
Hemoglobin: 15.3 g/dL (ref 14.1–18.1)
Lymph, poc: 2.4 (ref 0.6–3.4)
MCH, POC: 31.5 pg — AB (ref 27–31.2)
MCHC: 34.7 g/dL (ref 31.8–35.4)
MCV: 90.8 fL (ref 80–97)
MPV: 8.4 fL (ref 0–99.8)
POC Granulocyte: 6.3 (ref 2–6.9)
POC LYMPH PERCENT: 26.4 % (ref 10–50)
Platelet Count, POC: 176 10*3/uL (ref 142–424)
RBC: 4.9 M/uL (ref 4.69–6.13)
RDW, POC: 12.8 %
WBC: 9.2 10*3/uL (ref 4.6–10.2)

## 2012-10-05 LAB — LIPASE: Lipase: 15 U/L (ref 0–75)

## 2012-10-05 LAB — AMYLASE: Amylase: 33 U/L (ref 0–105)

## 2012-10-05 MED ORDER — IOHEXOL 300 MG/ML  SOLN
125.0000 mL | Freq: Once | INTRAMUSCULAR | Status: AC | PRN
  Administered 2012-10-05: 125 mL via INTRAVENOUS

## 2012-10-05 NOTE — Progress Notes (Signed)
Patient ID: Kirk Brown, male   DOB: [birth year], 62 y.o.   MRN: [id number] SUBJECTIVE: Chief Complaint  Patient presents with  . Abdominal Pain    low belly- urine ck last visit    HPI: Seen 5 days ago with left flank and back pain and microscopic hematuria. Today worse cannot sit . Pain in LLQ of abdomen. Has not eaten, cannot eat., no fever. No h/o kidney stones. But has h/o gout. Was being treated conservatively but in pain today  PMH/PSH: reviewed/updated in Epic  SH/FH: reviewed/updated in Epic  Allergies: reviewed/updated in Epic  Medications: reviewed/updated in Epic  Immunizations: reviewed/updated in Epic  ROS: As above in the HPI. All other systems are stable or negative.  OBJECTIVE: APPEARANCE:  Patient in no acute distress.The patient appeared well nourished and normally developed. Acyanotic. Waist: VITAL SIGNS:BP 122/60  Pulse 78  Temp(Src) 97.8 F (36.6 C) (Oral)  Ht 5\' 11"  (1.803 m)  Wt 234 lb (106.142 kg)  BMI 32.65 kg/m2 Central obesity WM   SKIN: warm and  Dry without overt rashes, tattoos and scars  HEAD and Neck: without JVD, Head and scalp: normal Eyes:No scleral icterus. Fundi normal, eye movements normal. Ears: Auricle normal, canal normal, Tympanic membranes normal, insufflation normal. Nose: normal Throat: normal Neck & thyroid: normal  CHEST & LUNGS: Chest wall: normal Lungs: Clear  CVS: Reveals the PMI to be normally located. Regular rhythm, First and Second Heart sounds are normal,  absence of murmurs, rubs or gallops. Peripheral vasculature: Radial pulses: normal Dorsal pedis pulses: normal Posterior pulses: normal  ABDOMEN:  Appearance: obese soft Benign, no organomegaly, no masses, no Abdominal Aortic enlargement. Tender LLQ with mild Guarding , no rebound. No Bruits. Bowel sounds: normal  RECTAL: N/A GU: N/A  EXTREMETIES: nonedematous. Both Femoral and Pedal pulses are normal.  MUSCULOSKELETAL:  Spine:  normal Joints: intact  NEUROLOGIC: oriented to time,place and person; nonfocal. Strength is normal Sensory is normal Reflexes are normal Cranial Nerves are normal.  ASSESSMENT: Abdominal pain, left lower quadrant - Plan: POCT CBC, COMPLETE METABOLIC PANEL WITH GFR, Amylase, Lipase, CT Abdomen Pelvis W Wo Contrast  HTN (hypertension)  Other and unspecified hyperlipidemia  Gout  With h/o risk factors for ASCVD need to not only r/o stone(uric acid) but also other pathologies. Discussed with medical director who gave a verbal approval.  PLAN: Orders Placed This Encounter  Procedures  . CT Abdomen Pelvis W Wo Contrast    This exam should ONLY be ordered for initial diagnosis or follow up of known pancreatic/liver/renal/bladder masses.    Standing Status: Future     Number of Occurrences:      Standing Expiration Date: 01/05/2014    Order Specific Question:  Reason for Exam (SYMPTOM  OR DIAGNOSIS REQUIRED)    Answer:  LLQ abdominal    Order Specific Question:  Preferred imaging location?    Answer:  [facility]  . COMPLETE METABOLIC PANEL WITH GFR  . Amylase  . Lipase  . POCT CBC  No orders of the defined types were placed in this encounter.    Await the stat CT. Called [facility]  And spoke to Wellsite geologist and verbal authorization given to go ahead and they will fax the authorization code in 12 hours.   Follow up Pending CT.  Carigan Lister P. Modesto Charon, M.D.

## 2012-10-06 NOTE — Progress Notes (Signed)
Quick Note:  Labs abnormal.sugar is a little high. will check with a HGBA1C at follow up on Thursday. The rest is normal ______

## 2012-10-08 ENCOUNTER — Encounter: Payer: Self-pay | Admitting: Family Medicine

## 2012-10-08 ENCOUNTER — Ambulatory Visit (INDEPENDENT_AMBULATORY_CARE_PROVIDER_SITE_OTHER): Payer: Managed Care, Other (non HMO) | Admitting: Family Medicine

## 2012-10-08 VITALS — BP 131/77 | HR 73 | Temp 97.8°F | Wt 236.2 lb

## 2012-10-08 DIAGNOSIS — R1032 Left lower quadrant pain: Secondary | ICD-10-CM

## 2012-10-08 DIAGNOSIS — E8881 Metabolic syndrome: Secondary | ICD-10-CM

## 2012-10-08 DIAGNOSIS — E119 Type 2 diabetes mellitus without complications: Secondary | ICD-10-CM | POA: Insufficient documentation

## 2012-10-08 DIAGNOSIS — E669 Obesity, unspecified: Secondary | ICD-10-CM | POA: Insufficient documentation

## 2012-10-08 DIAGNOSIS — R7309 Other abnormal glucose: Secondary | ICD-10-CM

## 2012-10-08 DIAGNOSIS — R739 Hyperglycemia, unspecified: Secondary | ICD-10-CM | POA: Insufficient documentation

## 2012-10-08 DIAGNOSIS — R109 Unspecified abdominal pain: Secondary | ICD-10-CM

## 2012-10-08 DIAGNOSIS — R1030 Lower abdominal pain, unspecified: Secondary | ICD-10-CM | POA: Insufficient documentation

## 2012-10-08 LAB — POCT GLYCOSYLATED HEMOGLOBIN (HGB A1C): Hemoglobin A1C: 8.2

## 2012-10-08 MED ORDER — METFORMIN HCL 500 MG PO TABS: 500.0000 mg | ORAL_TABLET | Freq: Two times a day (BID) | ORAL | Status: DC

## 2012-10-08 MED ORDER — TRAMADOL HCL 50 MG PO TABS: 100.0000 mg | ORAL_TABLET | Freq: Three times a day (TID) | ORAL | Status: DC | PRN

## 2012-10-08 NOTE — Patient Instructions (Addendum)
      Dr Donya Tomaro's Recommendations  Diet and Exercise discussed with patient.  For nutrition information, I recommend books:  1).Eat to Live by Dr Joel Fuhrman. 2).Prevent and Reverse Heart Disease by Dr Caldwell Esselstyn. 3) Dr Neal Barnard's Book:  Program to Reverse Diabetes  Exercise recommendations are:  If unable to walk, then the patient can exercise in a chair 3 times a day. By flapping arms like a bird gently and raising legs outwards to the front.  If ambulatory, the patient can go for walks for 30 minutes 3 times a week. Then increase the intensity and duration as tolerated.  Goal is to try to attain exercise frequency to 5 times a week.  If applicable: Best to perform resistance exercises (machines or weights) 2 days a week and cardio type exercises 3 days per week.  

## 2012-10-08 NOTE — Progress Notes (Signed)
Patient ID: Kirk Brown, male   DOB: 1951-02-07, 62 y.o.   MRN: [id number] SUBJECTIVE: CC: Chief Complaint  Patient presents with  . Follow-up    test results   HPI: Patient is here for follow up of hyperglycemia and LLQ abdominal pain: Symptoms of DM: Denies Nocturia ,Denies Urinary Frequency , denies Blurred vision ,deniesDizziness,denies.Dysuria,denies paresthesias, denies extremity pain or ulcers.Marland Kitchendenies chest pain. has had an annual eye exam. do check the feet.   PMH/PSH: reviewed/updated in Epic  SH/FH: reviewed/updated in Epic  Allergies: reviewed/updated in Epic  Medications: reviewed/updated in Epic  Immunizations: reviewed/updated in Epic  ROS: As above in the HPI. All other systems are stable or negative.  OBJECTIVE: APPEARANCE:  Patient in no acute distress.The patient appeared well nourished and normally developed. Acyanotic. Waist:44 inches VITAL SIGNS:BP 131/77  Pulse 73  Temp(Src) 97.8 F (36.6 C) (Oral)  Wt 236 lb 3.2 oz (107.14 kg)  BMI 32.96 kg/m2  WM centrally Obese  SKIN: warm and  Dry without overt rashes, tattoos and scars  HEAD and Neck: without JVD, Head and scalp: normal Eyes:No scleral icterus. Fundi normal, eye movements normal. Ears: Auricle normal, canal normal, Tympanic membranes normal, insufflation normal. Nose: normal Throat: normal Neck & thyroid: normal  CHEST & LUNGS: Chest wall: normal Lungs: Clear  CVS: Reveals the PMI to be normally located. Regular rhythm, First and Second Heart sounds are normal,  absence of murmurs, rubs or gallops. Peripheral vasculature: Radial pulses: normal Dorsal pedis pulses: normal Posterior pulses: normal  ABDOMEN:  Appearance:Obese Benign, no organomegaly, no masses, no Abdominal Aortic enlargement. No Guarding , no rebound. No Bruits. Bowel sounds: normal  RECTAL: N/A GU: tenderness in the posterior wall of the left inguinal canal with a small cough impulse. The  area of the  tenderness is small approximately 2 cm but it is exquisitely tender. Exquisite pain on sitting up at 90 degrees.relived by laying back ina reclined position. No surgical scars and no surgeries in the area.  EXTREMETIES: nonedematous. Both Femoral and Pedal pulses are normal.  MUSCULOSKELETAL:  Spine: normal Joints: intact  NEUROLOGIC: oriented to time,place and person; nonfocal. Strength is normal Sensory is normal Reflexes are normal Cranial Nerves are normal.  ASSESSMENT: Deep inguinal pain, left - Plan: Ambulatory referral to General Surgery, traMADol (ULTRAM) 50 MG tablet  Hyperglycemia - Plan: POCT glycosylated hemoglobin (Hb A1C)  DM (diabetes mellitus)  Metabolic syndrome  Obesity, unspecified    PLAN: Orders Placed This Encounter  Procedures  . Ambulatory referral to General Surgery    Referral Priority:  Routine    Referral Type:  Surgical    Referral Reason:  Specialty Services Required    Requested Specialty:  General Surgery    Number of Visits Requested:  1  . POCT glycosylated hemoglobin (Hb A1C)   Results for orders placed in visit on 10/08/12  POCT GLYCOSYLATED HEMOGLOBIN (HGB A1C)      Result Value Range   Hemoglobin A1C 8.2%     Meds ordered this encounter  Medications  . DISCONTD: traMADol (ULTRAM) 50 MG tablet    Sig:   . traMADol (ULTRAM) 50 MG tablet    Sig: Take 2 tablets (100 mg total) by mouth every 8 (eight) hours as needed for pain.    Dispense:  60 tablet    Refill:  0  . metFORMIN (GLUCOPHAGE) 500 MG tablet    Sig: Take 1 tablet (500 mg total) by mouth 2 (two) times daily with a meal.  Dispense:  60 tablet    Refill:  3   Patient going to [employer]. It is okay to go. Will need to see the surgeon on return. Spent 20 minutes on the first issue which is the LLQ pain which is actually an inguinal ligament issue, possibly a small hernia with a nerve impingement.       Dr Woodroe Mode Recommendations  Diet and Exercise  discussed with patient.  For nutrition information, I recommend books:  1).Eat to Live by Dr Monico Hoar. 2).Prevent and Reverse Heart Disease by Dr Suzzette Righter. 3) Dr Katherina Right Book:  Program to Reverse Diabetes  Exercise recommendations are:  If unable to walk, then the patient can exercise in a chair 3 times a day. By flapping arms like a bird gently and raising legs outwards to the front.  If ambulatory, the patient can go for walks for 30 minutes 3 times a week. Then increase the intensity and duration as tolerated.  Goal is to try to attain exercise frequency to 5 times a week.  If applicable: Best to perform resistance exercises (machines or weights) 2 days a week and cardio type exercises 3 days per week.   Spent another 25 minutes educating patient on his newly Diagnosed DM and the meaning of HGBA1C and the significance of his level  TOTAL TIME SPENT WITH PATIENT CONTACT WAS 45 minutes.  Return in about 6 weeks (around 11/19/2012) for Recheck medical problems.  Lailah Marcelli P. Modesto Charon, M.D.

## 2012-10-22 ENCOUNTER — Encounter (HOSPITAL_COMMUNITY): Payer: Self-pay | Admitting: Pharmacy Technician

## 2012-10-22 ENCOUNTER — Ambulatory Visit (INDEPENDENT_AMBULATORY_CARE_PROVIDER_SITE_OTHER): Payer: Managed Care, Other (non HMO) | Admitting: General Surgery

## 2012-10-22 ENCOUNTER — Encounter (INDEPENDENT_AMBULATORY_CARE_PROVIDER_SITE_OTHER): Payer: Self-pay | Admitting: General Surgery

## 2012-10-22 VITALS — BP 134/76 | HR 83 | Temp 98.3°F | Resp 18 | Ht 71.5 in | Wt 234.0 lb

## 2012-10-22 DIAGNOSIS — R1032 Left lower quadrant pain: Secondary | ICD-10-CM

## 2012-10-22 DIAGNOSIS — K429 Umbilical hernia without obstruction or gangrene: Secondary | ICD-10-CM

## 2012-10-22 NOTE — Progress Notes (Signed)
Patient ID: Kirk Brown, male   DOB: 08/21/1950, 62 y.o.   MRN: [id number]  Chief Complaint  Patient presents with  . New Evaluation    ing hernia    HPI Kirk Brown is a 62 y.o. male.  This patient is referred by Dr. Wong for evaluation of left lower quadrant and left inguinal pain. He says that this has been present for quite a while but has been worse over the last 2 months. He says that this is pretty much constant discomfort but fluctuates in severity and nothing really relieves his discomfort although he has been taking Ultram for this. He says that it is aggravated with certain movements and bending. He does not notice any bulge in the area. He says that his bowels are normal and he has no urinary symptoms. He says that he is current on his colonoscopy. He had a CT scan of the abdomen which by report did not demonstrate any cause for his left lower quadrant abdominal pain her symptoms although he may have a fat-containing right inguinal hernia on my review of the images. He also has a small umbilical hernia which he says has been present for several years and does cause discomfort with palpation but otherwise does not bother him  HPI  Past Medical History  Diagnosis Date  . Gout   . Hypertension   . Hyperlipemia   . RLS (restless legs syndrome)   . Diabetes mellitus without complication   . Wears glasses   . Arthritis     Past Surgical History  Procedure Laterality Date  . Colonoscopy    . Carpal tunnel release      both hands  . Tonsillectomy    . Shoulder arthroscopy      lt  . Elbow arthroscopy      both  . Ankle arthroscopy with arthrodesis  03/12/2012    Procedure: ANKLE ARTHROSCOPY WITH ARTHRODESIS;  Surgeon: John Hewitt, MD;  Location: [facility];  Service: Orthopedics;  Laterality: Right;  Right Ankle Arthroscopy with Arthrodesis and  Bone Marrow Aspiration Right Hip (BMAC)    Family History  Problem Relation Age of Onset  . Kidney Stones Mother    . Hyperlipidemia Mother   . Diabetes Mother   . Alzheimer's disease Mother   . Kidney Stones Sister     Social History History  Substance Use Topics  . Smoking status: Former Smoker    Types: Cigarettes    Quit date: 03/09/1989  . Smokeless tobacco: Never Used  . Alcohol Use: No     Comment: beer    No Known Allergies  Current Outpatient Prescriptions  Medication Sig Dispense Refill  . allopurinol (ZYLOPRIM) 300 MG tablet Take 300 mg by mouth every evening.      . aspirin EC 325 MG tablet Take 1 tablet (325 mg total) by mouth daily.  42 tablet  0  . atorvastatin (LIPITOR) 40 MG tablet Take 40 mg by mouth every evening.      . indomethacin (INDOCIN) 25 MG capsule Take 25 mg by mouth as needed.      . lisinopril (PRINIVIL,ZESTRIL) 20 MG tablet Take 20 mg by mouth every evening.      . metFORMIN (GLUCOPHAGE) 500 MG tablet Take 1 tablet (500 mg total) by mouth 2 (two) times daily with a meal.  60 tablet  3  . pramipexole (MIRAPEX) 0.5 MG tablet Take 0.5 mg by mouth every evening.      . traMADol (  ULTRAM) 50 MG tablet Take 2 tablets (100 mg total) by mouth every 8 (eight) hours as needed for pain.  60 tablet  0   No current facility-administered medications for this visit.    Review of Systems Review of Systems All other review of systems negative or noncontributory except as stated in the HPI  Blood pressure 134/76, pulse 83, temperature 98.3 F (36.8 C), resp. rate 18, height 5' 11.5" (1.816 m), weight 234 lb (106.142 kg).  Physical Exam Physical Exam Physical Exam  Vitals reviewed. Constitutional: He is oriented to person, place, and time. He appears well-developed and well-nourished. No distress.  HENT:  Head: Normocephalic and atraumatic.  Mouth/Throat: No oropharyngeal exudate.  Eyes: Conjunctivae and EOM are normal. Pupils are equal, round, and reactive to light. Right eye exhibits no discharge. Left eye exhibits no discharge. No scleral icterus.  Neck: Normal  range of motion. No tracheal deviation present.  Cardiovascular: Normal rate, regular rhythm and normal heart sounds.   Pulmonary/Chest: Effort normal and breath sounds normal. No stridor. No respiratory distress. He has no wheezes. He has no rales. He exhibits no tenderness.  Abdominal: Soft. Bowel sounds are normal. He exhibits no distension and no mass. There is no tenderness. There is no rebound and no guarding. small umbilical hernia. No evidence of inguinal hernia on exam, he does have left inguinal tenderness in the canal.  Testicles normal Musculoskeletal: Normal range of motion. He exhibits no edema and no tenderness.  Neurological: He is alert and oriented to person, place, and time.  Skin: Skin is warm and dry. No rash noted. He is not diaphoretic. No erythema. No pallor.  Psychiatric: He has a normal mood and affect. His behavior is normal. Judgment and thought content normal.    Data Reviewed CT  Assessment    Umbilical hernia Left groin pain If he does have a small umbilical hernia on exam which is tender to palpation.  I do not see any obvious cause on exam for his left inguinal pain. I do not appreciate any hernia on physical exam bilaterally although he may have a fat-containing hernia seen on CT scan. Since he does have a symptomatic umbilical hernia and inguinal pain, one option that I have offered to him would be repair of his umbilical hernia and at the same time through his umbilical defect I could place a laparoscope and evaluate his inguinal regions bilaterally. If he has hernias identified intraoperatively, then we would go ahead and plan for laparoscopic repair of his inguinal hernias simultaneously. If no hernias are identified then we would just performed umbilical hernia repair alone. I did explain to him the risks of this procedure including infection, bleeding, pain, scarring, recurrence, bowel injury, negative laparoscopy, nerve injury and chronic pain or persistent  symptoms postoperatively. I explained that even if he does have a hernia seen on the left and we repaired this that he could still have postoperative pain in his groin. I think that this would be a good option for him since he does have a known umbilical hernia anyway. He likes this options and would like to proceed with umbilical hernia repair and Diagnostic laparoscopy and possible bilateral inguinal hernia repair with mesh    Plan    We will set him up for surgery when available        Valetta Mulroy DAVID 10/22/2012, 11:44 AM    

## 2012-10-23 ENCOUNTER — Encounter (HOSPITAL_COMMUNITY)
Admission: RE | Admit: 2012-10-23 | Discharge: 2012-10-23 | Disposition: A | Discharge status: Home / Self Care | Payer: Managed Care, Other (non HMO) | Source: Ambulatory Visit | Attending: General Surgery | Admitting: General Surgery

## 2012-10-23 ENCOUNTER — Other Ambulatory Visit (HOSPITAL_COMMUNITY): Payer: Self-pay | Admitting: *Deleted

## 2012-10-23 ENCOUNTER — Encounter (HOSPITAL_COMMUNITY): Payer: Self-pay

## 2012-10-23 VITALS — BP 127/86 | HR 90 | Temp 98.2°F | Resp 16 | Ht 71.5 in | Wt 232.4 lb

## 2012-10-23 DIAGNOSIS — K429 Umbilical hernia without obstruction or gangrene: Secondary | ICD-10-CM

## 2012-10-23 LAB — BASIC METABOLIC PANEL
BUN: 19 mg/dL (ref 6–23)
CO2: 26 mEq/L (ref 19–32)
Chloride: 100 mEq/L (ref 96–112)
Creatinine, Ser: 0.82 mg/dL (ref 0.50–1.35)
GFR calc Af Amer: >90 mL/min (ref >90)
Potassium: 4.1 mEq/L (ref 3.5–5.1)

## 2012-10-23 LAB — CBC
HCT: 39.6 % (ref 39.0–52.0)
Hemoglobin: 14.2 g/dL (ref 13.0–17.0)
MCV: 88 fL (ref 78.0–100.0)
WBC: 8.3 10*3/uL (ref 4.0–10.5)

## 2012-10-23 LAB — SURGICAL PCR SCREEN: Staphylococcus aureus: NEGATIVE

## 2012-10-23 NOTE — Patient Instructions (Signed)
20      Your procedure is scheduled on:  Monday 11/02/2012  Report to [facility] at  0745 AM.  Call this number if you have problems the morning of surgery: [phone]   Remember:             IF YOU USE CPAP,BRING MASK AND TUBING AM OF SURGERY!   Do not eat food or drink liquids AFTER MIDNIGHT!  Take these medicines the morning of surgery with A SIP OF WATER: NONE   Do not bring valuables to the hospital. [facility] IS NOT RESPONSIBLE FOR ANY BELONGINGS OR VALUABLES.  Wynelle Fanny suitcase in the car. After surgery it may be brought to your room.  For patients admitted to the hospital, checkout time is 11:00 AM the day of              Discharge.    DO NOT WEAR JEWELRY , MAKE-UP, LOTIONS,POWDERS,PERFUMES!             WOMEN -DO NOT SHAVE LEGS OR UNDERARMS 12 HRS. BEFORE SURGERY!               MEN MAY SHAVE AS USUAL!             CONTACTS,DENTURES OR BRIDGEWORK, FALSE EYELASHES MAY  NOT BE WORN INTO SURGERY!                                           Patients discharged the day of surgery will not be allowed to drive home. If going home the same day of surgery, must have someone stay with you first 24 hrs.at home and arrange for someone to drive you home from the Hospital.                         YOUR DRIVER IS: wife-Madeline   Special Instructions:             Please read over the following fact sheets that you were given:             1. [place] PREPARING FOR SURGERY SHEET              2.[facility] INFORMATION              3.[facility]                                        Telford Nab.Kamea Dacosta,RN,BSN     [id number]                FAILURE TO FOLLOW THESE INSTRUCTIONS MAY RESULT IN  CANCELLATION OF YOUR SURGERY!               Patient Signature:___________________________

## 2012-10-23 NOTE — Progress Notes (Signed)
Consulted Dr. Acey Lav for him to see patient. Dr. Acey Lav states " Anesthesia will see patient morning of surgery".

## 2012-10-29 NOTE — Progress Notes (Signed)
Patient called and informed of time change. Will arrive 5:15 to 5:45a Mon  11-02-12

## 2012-11-02 ENCOUNTER — Encounter (HOSPITAL_COMMUNITY): Payer: Self-pay | Admitting: *Deleted

## 2012-11-02 ENCOUNTER — Encounter (HOSPITAL_COMMUNITY): Payer: Self-pay | Admitting: Anesthesiology

## 2012-11-02 ENCOUNTER — Ambulatory Visit (HOSPITAL_COMMUNITY): Payer: Managed Care, Other (non HMO) | Admitting: Anesthesiology

## 2012-11-02 ENCOUNTER — Encounter (HOSPITAL_COMMUNITY)
Admission: RE | Disposition: A | Discharge status: Home / Self Care | Payer: Self-pay | Source: Ambulatory Visit | Attending: General Surgery

## 2012-11-02 ENCOUNTER — Ambulatory Visit (HOSPITAL_COMMUNITY)
Admission: RE | Admit: 2012-11-02 | Discharge: 2012-11-02 | Disposition: A | Discharge status: Home / Self Care | Payer: Managed Care, Other (non HMO) | Source: Ambulatory Visit | Attending: General Surgery | Admitting: General Surgery

## 2012-11-02 DIAGNOSIS — E119 Type 2 diabetes mellitus without complications: Secondary | ICD-10-CM | POA: Insufficient documentation

## 2012-11-02 DIAGNOSIS — K4020 Bilateral inguinal hernia, without obstruction or gangrene, not specified as recurrent: Secondary | ICD-10-CM

## 2012-11-02 DIAGNOSIS — E785 Hyperlipidemia, unspecified: Secondary | ICD-10-CM | POA: Insufficient documentation

## 2012-11-02 DIAGNOSIS — K429 Umbilical hernia without obstruction or gangrene: Secondary | ICD-10-CM | POA: Insufficient documentation

## 2012-11-02 DIAGNOSIS — Z7982 Long term (current) use of aspirin: Secondary | ICD-10-CM | POA: Insufficient documentation

## 2012-11-02 DIAGNOSIS — I10 Essential (primary) hypertension: Secondary | ICD-10-CM | POA: Insufficient documentation

## 2012-11-02 DIAGNOSIS — Z79899 Other long term (current) drug therapy: Secondary | ICD-10-CM | POA: Insufficient documentation

## 2012-11-02 HISTORY — PX: UMBILICAL HERNIA REPAIR: SHX196

## 2012-11-02 HISTORY — PX: LAPAROSCOPY: SHX197

## 2012-11-02 HISTORY — PX: INSERTION OF MESH: SHX5868

## 2012-11-02 LAB — GLUCOSE, CAPILLARY
Glucose-Capillary: 186 mg/dL — ABNORMAL HIGH (ref 70–99)
Glucose-Capillary: 152 mg/dL — ABNORMAL HIGH (ref 70–99)

## 2012-11-02 SURGERY — LAPAROSCOPY, DIAGNOSTIC
Anesthesia: General | Site: Abdomen | Wound class: Clean

## 2012-11-02 MED ORDER — MIDAZOLAM HCL 2 MG/2ML IJ SOLN
INTRAMUSCULAR | Status: AC
  Administered 2012-11-02: 1 mg
  Filled 2012-11-02: qty 2

## 2012-11-02 MED ORDER — BUPIVACAINE HCL (PF) 0.25 % IJ SOLN
INTRAMUSCULAR | Status: AC
  Filled 2012-11-02: qty 30

## 2012-11-02 MED ORDER — HYDROMORPHONE HCL PF 1 MG/ML IJ SOLN
INTRAMUSCULAR | Status: AC
  Filled 2012-11-02: qty 1

## 2012-11-02 MED ORDER — BUPIVACAINE-EPINEPHRINE 0.25% -1:200000 IJ SOLN
INTRAMUSCULAR | Status: DC | PRN
  Administered 2012-11-02: 10 mL

## 2012-11-02 MED ORDER — HYDROCODONE-ACETAMINOPHEN 5-325 MG PO TABS
1.0000 | ORAL_TABLET | ORAL | Status: DC | PRN
  Administered 2012-11-02: 1 via ORAL
  Filled 2012-11-02: qty 1

## 2012-11-02 MED ORDER — PHENYLEPHRINE HCL 10 MG/ML IJ SOLN
INTRAMUSCULAR | Status: DC | PRN
  Administered 2012-11-02: 80 ug via INTRAVENOUS

## 2012-11-02 MED ORDER — HYDROMORPHONE HCL PF 1 MG/ML IJ SOLN
0.2500 mg | INTRAMUSCULAR | Status: DC | PRN
  Administered 2012-11-02 (×3): 0.25 mg via INTRAVENOUS
  Administered 2012-11-02 (×2): 0.5 mg via INTRAVENOUS

## 2012-11-02 MED ORDER — LIDOCAINE HCL 1 % IJ SOLN
INTRAMUSCULAR | Status: DC | PRN
  Administered 2012-11-02: 10 mL

## 2012-11-02 MED ORDER — ROCURONIUM BROMIDE 100 MG/10ML IV SOLN
INTRAVENOUS | Status: DC | PRN
  Administered 2012-11-02: 10 mg via INTRAVENOUS
  Administered 2012-11-02 (×2): 15 mg via INTRAVENOUS
  Administered 2012-11-02: 50 mg via INTRAVENOUS

## 2012-11-02 MED ORDER — ONDANSETRON HCL 4 MG/2ML IJ SOLN
INTRAMUSCULAR | Status: DC | PRN
  Administered 2012-11-02: 4 mg via INTRAVENOUS

## 2012-11-02 MED ORDER — PROMETHAZINE HCL 25 MG/ML IJ SOLN: 6.2500 mg | INTRAMUSCULAR | Status: DC | PRN

## 2012-11-02 MED ORDER — LACTATED RINGERS IV SOLN: INTRAVENOUS | Status: DC

## 2012-11-02 MED ORDER — FENTANYL CITRATE 0.05 MG/ML IJ SOLN
INTRAMUSCULAR | Status: DC | PRN
  Administered 2012-11-02: 25 ug via INTRAVENOUS
  Administered 2012-11-02 (×4): 50 ug via INTRAVENOUS
  Administered 2012-11-02: 100 ug via INTRAVENOUS
  Administered 2012-11-02: 50 ug via INTRAVENOUS
  Administered 2012-11-02: 25 ug via INTRAVENOUS

## 2012-11-02 MED ORDER — LIDOCAINE-EPINEPHRINE 1 %-1:100000 IJ SOLN
INTRAMUSCULAR | Status: AC
  Filled 2012-11-02: qty 1

## 2012-11-02 MED ORDER — NEOSTIGMINE METHYLSULFATE 1 MG/ML IJ SOLN
INTRAMUSCULAR | Status: DC | PRN
  Administered 2012-11-02: 4 mg via INTRAVENOUS

## 2012-11-02 MED ORDER — HYDROCODONE-ACETAMINOPHEN 5-325 MG PO TABS: 1.0000 | ORAL_TABLET | ORAL | Status: DC | PRN

## 2012-11-02 MED ORDER — EPHEDRINE SULFATE 50 MG/ML IJ SOLN
INTRAMUSCULAR | Status: DC | PRN
  Administered 2012-11-02: 5 mg via INTRAVENOUS

## 2012-11-02 MED ORDER — DOCUSATE SODIUM 100 MG PO CAPS: 100.0000 mg | ORAL_CAPSULE | Freq: Two times a day (BID) | ORAL | Status: DC | PRN

## 2012-11-02 MED ORDER — KETOROLAC TROMETHAMINE 30 MG/ML IJ SOLN
30.0000 mg | Freq: Once | INTRAMUSCULAR | Status: AC
  Administered 2012-11-02: 30 mg via INTRAVENOUS
  Filled 2012-11-02: qty 1

## 2012-11-02 MED ORDER — LIDOCAINE HCL (CARDIAC) 20 MG/ML IV SOLN
INTRAVENOUS | Status: DC | PRN
  Administered 2012-11-02: 50 mg via INTRAVENOUS
  Administered 2012-11-02: 40 mg via INTRAVENOUS

## 2012-11-02 MED ORDER — CEFAZOLIN SODIUM-DEXTROSE 2-3 GM-% IV SOLR
2.0000 g | INTRAVENOUS | Status: AC
  Administered 2012-11-02: 2 g via INTRAVENOUS

## 2012-11-02 MED ORDER — GLYCOPYRROLATE 0.2 MG/ML IJ SOLN
INTRAMUSCULAR | Status: DC | PRN
  Administered 2012-11-02: .6 mg via INTRAVENOUS

## 2012-11-02 MED ORDER — MIDAZOLAM HCL 5 MG/5ML IJ SOLN
INTRAMUSCULAR | Status: DC | PRN
  Administered 2012-11-02: 2 mg via INTRAVENOUS

## 2012-11-02 MED ORDER — PROPOFOL 10 MG/ML IV BOLUS
INTRAVENOUS | Status: DC | PRN
  Administered 2012-11-02: 200 mg via INTRAVENOUS

## 2012-11-02 MED ORDER — CEFAZOLIN SODIUM-DEXTROSE 2-3 GM-% IV SOLR
INTRAVENOUS | Status: AC
  Filled 2012-11-02: qty 50

## 2012-11-02 MED ORDER — LACTATED RINGERS IV SOLN
INTRAVENOUS | Status: DC | PRN
  Administered 2012-11-02 (×3): via INTRAVENOUS

## 2012-11-02 SURGICAL SUPPLY — 82 items
APPLIER CLIP 5 13 M/L LIGAMAX5 (MISCELLANEOUS)
APPLIER CLIP ROT 10 11.4 M/L (STAPLE)
BLADE EXTENDED COATED 6.5IN (ELECTRODE) IMPLANT
BLADE HEX COATED 2.75 (ELECTRODE) ×2 IMPLANT
BLADE SURG 15 STRL LF DISP TIS (BLADE) ×1 IMPLANT
BLADE SURG 15 STRL SS (BLADE) ×1
BLADE SURG SZ10 CARB STEEL (BLADE) IMPLANT
CANISTER SUCTION 2500CC (MISCELLANEOUS) ×2 IMPLANT
CANNULA ENDOPATH XCEL 11M (ENDOMECHANICALS) IMPLANT
CLIP APPLIE 5 13 M/L LIGAMAX5 (MISCELLANEOUS) IMPLANT
CLIP APPLIE ROT 10 11.4 M/L (STAPLE) IMPLANT
CLOTH BEACON ORANGE TIMEOUT ST (SAFETY) ×2 IMPLANT
COTTON STERILE ROLL (GAUZE/BANDAGES/DRESSINGS) ×2 IMPLANT
COVER MAYO STAND STRL (DRAPES) ×2 IMPLANT
DECANTER SPIKE VIAL GLASS SM (MISCELLANEOUS) ×2 IMPLANT
DERMABOND ADVANCED (GAUZE/BANDAGES/DRESSINGS) ×1
DERMABOND ADVANCED .7 DNX12 (GAUZE/BANDAGES/DRESSINGS) ×1 IMPLANT
DEVICE SECURE STRAP 25 ABSORB (INSTRUMENTS) ×2 IMPLANT
DEVICE TROCAR PUNCTURE CLOSURE (ENDOMECHANICALS) ×2 IMPLANT
DRAPE LAPAROSCOPIC ABDOMINAL (DRAPES) ×2 IMPLANT
DRAPE WARM FLUID 44X44 (DRAPE) IMPLANT
DRSG TEGADERM 4X4.75 (GAUZE/BANDAGES/DRESSINGS) ×4 IMPLANT
ELECT REM PT RETURN 9FT ADLT (ELECTROSURGICAL) ×2
ELECTRODE REM PT RTRN 9FT ADLT (ELECTROSURGICAL) ×1 IMPLANT
FILTER SMOKE EVAC LAPAROSHD (FILTER) IMPLANT
GLOVE BIO SURGEON STRL SZ7 (GLOVE) ×4 IMPLANT
GLOVE BIOGEL M 7.0 STRL (GLOVE) ×4 IMPLANT
GLOVE BIOGEL PI IND STRL 7.0 (GLOVE) ×2 IMPLANT
GLOVE BIOGEL PI INDICATOR 7.0 (GLOVE) ×2
GLOVE ECLIPSE 7.0 STRL STRAW (GLOVE) IMPLANT
GLOVE SURG SS PI 7.5 STRL IVOR (GLOVE) ×4 IMPLANT
GOWN PREVENTION PLUS LG XLONG (DISPOSABLE) ×2 IMPLANT
GOWN STRL NON-REIN LRG LVL3 (GOWN DISPOSABLE) ×2 IMPLANT
GOWN STRL REIN XL XLG (GOWN DISPOSABLE) ×4 IMPLANT
KIT BASIN OR (CUSTOM PROCEDURE TRAY) ×2 IMPLANT
LIGASURE IMPACT 36 18CM CVD LR (INSTRUMENTS) IMPLANT
MESH ULTRAPRO 6X6 15CM15CM (Mesh General) ×4 IMPLANT
NEEDLE HYPO 22GX1.5 SAFETY (NEEDLE) ×2 IMPLANT
NS IRRIG 1000ML POUR BTL (IV SOLUTION) ×2 IMPLANT
PACK BASIC VI WITH GOWN DISP (CUSTOM PROCEDURE TRAY) ×2 IMPLANT
PENCIL BUTTON HOLSTER BLD 10FT (ELECTRODE) ×2 IMPLANT
SCALPEL HARMONIC ACE (MISCELLANEOUS) IMPLANT
SET IRRIG TUBING LAPAROSCOPIC (IRRIGATION / IRRIGATOR) IMPLANT
SOLUTION ANTI FOG 6CC (MISCELLANEOUS) ×2 IMPLANT
SPONGE GAUZE 4X4 12PLY (GAUZE/BANDAGES/DRESSINGS) ×2 IMPLANT
SPONGE LAP 18X18 X RAY DECT (DISPOSABLE) IMPLANT
STAPLER VISISTAT 35W (STAPLE) IMPLANT
STRIP CLOSURE SKIN 1/2X4 (GAUZE/BANDAGES/DRESSINGS) ×2 IMPLANT
SUCTION POOLE TIP (SUCTIONS) IMPLANT
SUT ETHIBOND 0 MO6 C/R (SUTURE) ×2 IMPLANT
SUT ETHIBOND NAB CT1 #1 30IN (SUTURE) IMPLANT
SUT MNCRL AB 4-0 PS2 18 (SUTURE) ×2 IMPLANT
SUT NOVA NAB GS-21 0 18 T12 DT (SUTURE) IMPLANT
SUT PDS AB 1 TP1 96 (SUTURE) IMPLANT
SUT PROLENE 0 CT 1 30 (SUTURE) IMPLANT
SUT PROLENE 0 CT 1 CR/8 (SUTURE) IMPLANT
SUT PROLENE 2 0 KS (SUTURE) IMPLANT
SUT PROLENE 2 0 SH DA (SUTURE) IMPLANT
SUT SILK 2 0 (SUTURE)
SUT SILK 2 0 SH CR/8 (SUTURE) IMPLANT
SUT SILK 2-0 18XBRD TIE 12 (SUTURE) IMPLANT
SUT SILK 3 0 (SUTURE)
SUT SILK 3 0 SH CR/8 (SUTURE) IMPLANT
SUT SILK 3-0 18XBRD TIE 12 (SUTURE) IMPLANT
SUT VIC AB 3-0 SH 27 (SUTURE) ×2
SUT VIC AB 3-0 SH 27X BRD (SUTURE) ×2 IMPLANT
SUT VIC AB 3-0 SH 27XBRD (SUTURE) IMPLANT
SUT VICRYL 0 UR6 27IN ABS (SUTURE) ×2 IMPLANT
SYR BULB IRRIGATION 50ML (SYRINGE) ×2 IMPLANT
SYR CONTROL 10ML LL (SYRINGE) ×4 IMPLANT
SYS LAPSCP GELPORT 120MM (MISCELLANEOUS)
SYSTEM LAPSCP GELPORT 120MM (MISCELLANEOUS) IMPLANT
TOWEL OR 17X26 10 PK STRL BLUE (TOWEL DISPOSABLE) ×2 IMPLANT
TRAY FOLEY CATH 14FRSI W/METER (CATHETERS) ×2 IMPLANT
TRAY LAP CHOLE (CUSTOM PROCEDURE TRAY) ×2 IMPLANT
TROCAR BLADELESS OPT 5 75 (ENDOMECHANICALS) ×4 IMPLANT
TROCAR XCEL 12X100 BLDLESS (ENDOMECHANICALS) IMPLANT
TROCAR XCEL BLUNT TIP 100MML (ENDOMECHANICALS) IMPLANT
TROCAR XCEL NON-BLD 11X100MML (ENDOMECHANICALS) IMPLANT
TUBING INSUFFLATION 10FT LAP (TUBING) ×2 IMPLANT
YANKAUER SUCT BULB TIP 10FT TU (MISCELLANEOUS) ×2 IMPLANT
YANKAUER SUCT BULB TIP NO VENT (SUCTIONS) IMPLANT

## 2012-11-02 NOTE — H&P (View-Only) (Signed)
Patient ID: Kirk Brown, male   DOB: [birth year], 62 y.o.   MRN: [id number]  Chief Complaint  Patient presents with  . New Evaluation    ing hernia    HPI Kirk Brown is a 62 y.o. male.  This patient is referred by Dr. Modesto Charon for evaluation of left lower quadrant and left inguinal pain. He says that this has been present for quite a while but has been worse over the last 2 months. He says that this is pretty much constant discomfort but fluctuates in severity and nothing really relieves his discomfort although he has been taking Ultram for this. He says that it is aggravated with certain movements and bending. He does not notice any bulge in the area. He says that his bowels are normal and he has no urinary symptoms. He says that he is current on his colonoscopy. He had a CT scan of the abdomen which by report did not demonstrate any cause for his left lower quadrant abdominal pain her symptoms although he may have a fat-containing right inguinal hernia on my review of the images. He also has a small umbilical hernia which he says has been present for several years and does cause discomfort with palpation but otherwise does not bother him  HPI  Past Medical History  Diagnosis Date  . Gout   . Hypertension   . Hyperlipemia   . RLS (restless legs syndrome)   . Diabetes mellitus without complication   . Wears glasses   . Arthritis     Past Surgical History  Procedure Laterality Date  . Colonoscopy    . Carpal tunnel release      both hands  . Tonsillectomy    . Shoulder arthroscopy      lt  . Elbow arthroscopy      both  . Ankle arthroscopy with arthrodesis  03/12/2012    Procedure: ANKLE ARTHROSCOPY WITH ARTHRODESIS;  Surgeon: Toni Arthurs, MD;  Location:  SUR[facility]ERY [facility];  Service: Orthopedics;  Laterality: Right;  Right Ankle Arthroscopy with Arthrodesis and  Bone Marrow Aspiration Right Hip (BMAC)    Family History  Problem Relation Age of Onset  . Kidney Stones Mother    . Hyperlipidemia Mother   . Diabetes Mother   . Alzheimer's disease Mother   . Kidney Stones Sister     Social History History  Substance Use Topics  . Smoking status: Former Smoker    Types: Cigarettes    Quit date: 03/09/1989  . Smokeless tobacco: Never Used  . Alcohol Use: No     Comment: beer    No Known Allergies  Current Outpatient Prescriptions  Medication Sig Dispense Refill  . allopurinol (ZYLOPRIM) 300 MG tablet Take 300 mg by mouth every evening.      Marland Kitchen aspirin EC 325 MG tablet Take 1 tablet (325 mg total) by mouth daily.  42 tablet  0  . atorvastatin (LIPITOR) 40 MG tablet Take 40 mg by mouth every evening.      . indomethacin (INDOCIN) 25 MG capsule Take 25 mg by mouth as needed.      Marland Kitchen lisinopril (PRINIVIL,ZESTRIL) 20 MG tablet Take 20 mg by mouth every evening.      . metFORMIN (GLUCOPHAGE) 500 MG tablet Take 1 tablet (500 mg total) by mouth 2 (two) times daily with a meal.  60 tablet  3  . pramipexole (MIRAPEX) 0.5 MG tablet Take 0.5 mg by mouth every evening.      . traMADol (  ULTRAM) 50 MG tablet Take 2 tablets (100 mg total) by mouth every 8 (eight) hours as needed for pain.  60 tablet  0   No current facility-administered medications for this visit.    Review of Systems Review of Systems All other review of systems negative or noncontributory except as stated in the HPI  Blood pressure 134/76, pulse 83, temperature 98.3 F (36.8 C), resp. rate 18, height 5' 11.5" (1.816 m), weight 234 lb (106.142 kg).  Physical Exam Physical Exam Physical Exam  Vitals reviewed. Constitutional: He is oriented to person, place, and time. He appears well-developed and well-nourished. No distress.  HENT:  Head: Normocephalic and atraumatic.  Mouth/Throat: No oropharyngeal exudate.  Eyes: Conjunctivae and EOM are normal. Pupils are equal, round, and reactive to light. Right eye exhibits no discharge. Left eye exhibits no discharge. No scleral icterus.  Neck: Normal  range of motion. No tracheal deviation present.  Cardiovascular: Normal rate, regular rhythm and normal heart sounds.   Pulmonary/Chest: Effort normal and breath sounds normal. No stridor. No respiratory distress. He has no wheezes. He has no rales. He exhibits no tenderness.  Abdominal: Soft. Bowel sounds are normal. He exhibits no distension and no mass. There is no tenderness. There is no rebound and no guarding. small umbilical hernia. No evidence of inguinal hernia on exam, he does have left inguinal tenderness in the canal.  Testicles normal Musculoskeletal: Normal range of motion. He exhibits no edema and no tenderness.  Neurological: He is alert and oriented to person, place, and time.  Skin: Skin is warm and dry. No rash noted. He is not diaphoretic. No erythema. No pallor.  Psychiatric: He has a normal mood and affect. His behavior is normal. Judgment and thought content normal.    Data Reviewed CT  Assessment    Umbilical hernia Left groin pain If he does have a small umbilical hernia on exam which is tender to palpation.  I do not see any obvious cause on exam for his left inguinal pain. I do not appreciate any hernia on physical exam bilaterally although he may have a fat-containing hernia seen on CT scan. Since he does have a symptomatic umbilical hernia and inguinal pain, one option that I have offered to him would be repair of his umbilical hernia and at the same time through his umbilical defect I could place a laparoscope and evaluate his inguinal regions bilaterally. If he has hernias identified intraoperatively, then we would go ahead and plan for laparoscopic repair of his inguinal hernias simultaneously. If no hernias are identified then we would just performed umbilical hernia repair alone. I did explain to him the risks of this procedure including infection, bleeding, pain, scarring, recurrence, bowel injury, negative laparoscopy, nerve injury and chronic pain or persistent  symptoms postoperatively. I explained that even if he does have a hernia seen on the left and we repaired this that he could still have postoperative pain in his groin. I think that this would be a good option for him since he does have a known umbilical hernia anyway. He likes this options and would like to proceed with umbilical hernia repair and Diagnostic laparoscopy and possible bilateral inguinal hernia repair with mesh    Plan    We will set him up for surgery when available        Jerome Otter DAVID 10/22/2012, 11:44 AM

## 2012-11-02 NOTE — Transfer of Care (Signed)
Immediate Anesthesia Transfer of Care Note  Patient: Kirk Brown  Procedure(s) Performed: Procedure(s): LAPAROSCOPY DIAGNOSTIC  bilateral inguinal hernia  (N/A) HERNIA REPAIR UMBILICAL ADULT (N/A) INSERTION OF MESH (N/A)  Patient Location: PACU  Anesthesia Type:General  Level of Consciousness: awake, alert  and patient cooperative  Airway & Oxygen Therapy: Patient Spontanous Breathing  Post-op Assessment: Report given to PACU RN, Post -op Vital signs reviewed and stable and Patient moving all extremities  Post vital signs: Reviewed and stable  Complications: No apparent anesthesia complications

## 2012-11-02 NOTE — Brief Op Note (Signed)
11/02/2012  10:28 AM  PATIENT:  Kerry Hough  62 y.o. male  PRE-OPERATIVE DIAGNOSIS:  UMBILICAL HERNIA   POST-OPERATIVE DIAGNOSIS:  UMBILICAL HERNIA, bilateral inguinal hernia repair with mesh  PROCEDURE:  Procedure(s): LAPAROSCOPY DIAGNOSTIC  bilateral inguinal hernia  (N/A) HERNIA REPAIR UMBILICAL ADULT (N/A) INSERTION OF MESH (N/A)  SURGEON:  Surgeon(s) and Role:    * Lodema Pilot, DO - Primary  PHYSICIAN ASSISTANT:   ASSISTANTS: none   ANESTHESIA:   general  EBL:  Total I/O In: 2000 [I.V.:2000] Out: 152 [Urine:127; Blood:25]  BLOOD ADMINISTERED:none  DRAINS: none   LOCAL MEDICATIONS USED:  MARCAINE    and LIDOCAINE   SPECIMEN:  No Specimen  DISPOSITION OF SPECIMEN:  N/A  COUNTS:  YES  TOURNIQUET:  * No tourniquets in log *  DICTATION: .Other Dictation: Dictation Number dictated  PLAN OF CARE: Discharge to home after PACU  PATIENT DISPOSITION:  PACU - hemodynamically stable.   Delay start of Pharmacological VTE agent (>24hrs) due to surgical blood loss or risk of bleeding: no

## 2012-11-02 NOTE — Interval H&P Note (Signed)
History and Physical Interval Note:  11/02/2012 7:12 AM  Kerry Hough  has presented today for surgery, with the diagnosis of UMBILICAL HERNIA   The various methods of treatment have been discussed with the patient and family. After consideration of risks, benefits and other options for treatment, the patient has consented to  Procedure(s): LAPAROSCOPY DIAGNOSTIC possible bilateral inguinal hernia  (N/A) HERNIA REPAIR UMBILICAL ADULT (N/A) INSERTION OF MESH (N/A) as a surgical intervention .  The patient's history has been reviewed, patient examined, no change in status, stable for surgery.  I have reviewed the patient's chart and labs.  Questions were answered to the patient's satisfaction.  I have seen and evaluated him in the preop area.  Sites marked.  He does have an umbilical hernia and we will plan on repair of this and placement of a camera and if any inguinal hernias are identified, we will plan on TAPP repair.  If no hernias area identified, then we will plan on umbilical hernia repair alone.  He expressed understanding that this may not fix his pain.  We discussed the risks of infection, bleeding, pain, scarring, recurrence, injury to bowel, or testicle or vas deferens, persistent pain, or chronic pain, and he expressed understanding and desires to proceed with planned procedure.   Lodema Pilot DAVID

## 2012-11-02 NOTE — Anesthesia Preprocedure Evaluation (Addendum)
Anesthesia Evaluation  Patient identified by MRN, date of birth, ID band Patient awake    Reviewed: Allergy & Precautions, H&P , NPO status , Patient's Chart, lab work & pertinent test results  Airway       Dental   Pulmonary neg pulmonary ROS, former smoker,          Cardiovascular hypertension, Pt. on medications     Neuro/Psych negative neurological ROS  negative psych ROS   GI/Hepatic negative GI ROS, Neg liver ROS,   Endo/Other  diabetes, Type 2, Oral Hypoglycemic Agents  Renal/GU negative Renal ROS  negative genitourinary   Musculoskeletal negative musculoskeletal ROS (+)   Abdominal   Peds negative pediatric ROS (+)  Hematology negative hematology ROS (+)   Anesthesia Other Findings   Reproductive/Obstetrics                           Anesthesia Physical Anesthesia Plan  ASA: II  Anesthesia Plan: General   Post-op Pain Management:    Induction: Intravenous  Airway Management Planned: Oral ETT  Additional Equipment:   Intra-op Plan:   Post-operative Plan: Extubation in OR  Informed Consent: I have reviewed the patients History and Physical, chart, labs and discussed the procedure including the risks, benefits and alternatives for the proposed anesthesia with the patient or authorized representative who has indicated his/her understanding and acceptance.   Dental advisory given  Plan Discussed with: CRNA  Anesthesia Plan Comments:         Anesthesia Quick Evaluation

## 2012-11-02 NOTE — Anesthesia Postprocedure Evaluation (Signed)
Anesthesia Post Note  Patient: Kirk Brown  Procedure(s) Performed: Procedure(s) (LRB): LAPAROSCOPY DIAGNOSTIC  bilateral inguinal hernia  (N/A) HERNIA REPAIR UMBILICAL ADULT (N/A) INSERTION OF MESH (N/A)  Anesthesia type: General  Patient location: PACU  Post pain: Pain level controlled  Post assessment: Post-op Vital signs reviewed  Last Vitals:  Filed Vitals:   11/02/12 1213  BP: 121/75  Pulse: 84  Temp: 36.7 C  Resp: 16    Post vital signs: Reviewed  Level of consciousness: sedated  Complications: No apparent anesthesia complications

## 2012-11-03 ENCOUNTER — Encounter (HOSPITAL_COMMUNITY): Payer: Self-pay | Admitting: General Surgery

## 2012-11-03 NOTE — Op Note (Signed)
NAMEEMRICK, HENSCH NO.:  [id number]  MEDICAL RECORD NO.:  [id number]  LOCATION:  [facility]                         FACILITY:  [facility]  PHYSICIAN:  Lodema Pilot, MD       DATE OF BIRTH:  16-Aug-1950  DATE OF PROCEDURE:  11/02/2012 DATE OF DISCHARGE:  11/02/2012                              OPERATIVE REPORT   PROCEDURE:  Open umbilical hernia repair with bilateral inguinal hernia repairs with mesh with mesh.  PREOPERATIVE DIAGNOSIS:  Left groin pain and umbilical hernia.  POSTOPERATIVE DIAGNOSIS:  Bilateral inguinal hernias and umbilical hernia.  FLUIDS:  2 L crystalloid.  ESTIMATED BLOOD LOSS:  Minimal.  DRAINS:  None.  SPECIMENS:  None.  COMPLICATIONS:  None apparent.  FINDINGS:  Bilateral indirect inguinal hernia with cord lipoma and small umbilical hernia, placement of 12 cm x 13 cm pieces of UltraPro mesh in each inguinal regions in the preperitoneal space and closure of umbilical hernia without mesh.  INDICATIONS FOR PROCEDURE:  Kirk Brown is a 62 year old male with a symptomatic umbilical hernia and left groin pain.  No obvious hernia was found on physical exam, and so we decided to perform diagnostic laparoscopy and umbilical hernia repair.  OPERATIVE DETAILS:  Kirk Brown was seen and evaluated in the preoperative area and risks and benefits of procedure were discussed in lay terms.  Informed consent was obtained.  Prophylactic antibiotics were given.  The surgical site was marked prior to anesthetic administration.  He did have an obvious umbilical hernia and again in the preoperative area, I could not appreciate any inguinal hernias on exam.  He was given prophylactic antibiotics and taken to the operating room, placed on the table in supine position.  General endotracheal anesthesia was obtained.  Foley catheters were placed.  His arms were tucked bilaterally.  Abdomen was prepped and groin were prepped and draped in a standard surgical  fashion.  A semicircular infraumbilical incision was made in the skin and dissection carried down to subcutaneous tissue using blunt dissection.  The umbilicus was taken off the underlying fascia and he had a small umbilical hernia defect.  This was very tight and I could not even put my finger in the defect and had actually enlarged the defect slightly to accommodate placement of a 12-mm balloon trocar into the peritoneum.  Pneumoperitoneum was obtained.  The laparoscope was introduced.  There was no evidence of bleeding or bowel injury upon entry.  The laparoscope was used to investigate the inguinal region.  He had a small indirect hernia on the right.  No evidence of any direct hernia on the right.  I could not see any obvious hernia on the left as the sigmoid colon was adhered to the region.  I placed two 5 mm trocars through the right and left rectus, and using sharp dissection, took down the colon along the white line of Toldt in the sigmoid region, taking care to avoid injury to the bowel.  After I could do this, I could see that he did have a fairly sizable indirect defect and then decided to perform transabdominal preperitoneal laparoscopic inguinal hernia repair bilaterally starting on the most symptomatic site and took down  the peritoneum.  The peritoneum was taken down using sharp dissection and the preperitoneal space was developed down to the pubic tubercle medially, and the peritoneum was reduced from the inguinal canal and the cord structures were identified and preserved.  The hernia sac was taken down posterior towards the vas deferens and medially and he did not have any direct hernia on this side as well.  After the hernia sac was taken down posteriorly, a 12 cm x 13 cm UltraPro mesh was placed into the preperitoneal space to cover the hernia defect.  This was tacked laterally anterior to the superior iliac spine using the SecureStrap absorbable tacking device, and  medially to the abdominal wall.  Small slit was placed in the mesh around the cord structures and further tacked the mesh and the mesh was covering all the hernia defects and the peritoneum was tacked back up to the abdominal wall to cover the mesh.  There was some bleeding from the epigastric vessels and the abdominal wall and 2 places on the left side.  I used the Endoclose device and 0 Vicryl suture to control the bleeding from the abdominal wall.  Then, a similar dissection was performed on the right side. Peritoneal flap was created and the peritoneum was taken down.  He actually had a decent sized cord lipoma as well on the right.  There was no direct inguinal hernia.  He only had indirect hernia on this side as well as well as a cord lipoma.  After the peritoneum was taken down, a 12 cm x 13 cm UltraPro mesh was placed in similar fashion as the contralateral side.  Then, the mesh was tacked medially and laterally to the abdominal wall.  The peritoneum was then tacked back up to the abdominal wall covering the mesh using the SecureStrap device.  The abdominal wall on both sides appeared to be hemostatic and the mesh appeared to lay flat and cover the hernia defect, and there was no evidence of bowel injury.  Then, I removed the 12 mm umbilical trocar and decided not to place any umbilical mesh as I would have to make the hole even larger to accommodate the mesh placement, so I used interrupted 0 Ethibond sutures to close the umbilical fascia and he actually had very solid fascia in this area.  The sutures were secured and the abdomen was re-insufflated through the 5 mm lateral trocar and the umbilical closure was noted be adequate without any evidence of bleeding or bowel injury and the contralateral trocars were removed under direct visualization.  Abdominal wall was noted be hemostatic and again, there was no evidence of intra-abdominal bleeding or bowel injury.  The gas was  removed and the final trocar was removed.  The umbilical wound was irrigated with sterile saline solution and the umbilicus was tacked to the underlying fascia using two 3-0 Vicryl sutures.  He still had a small amount of bulging of his umbilicus mainly on the right side, which could appear to be an umbilical hernia in the future, but again, the fascia was well approximated.  The dermis was then approximated with interrupted 3-0 Vicryl sutures and the skin edges were approximated with 4-0 Monocryl subcuticular suture.  Skin was washed and dried, and Dermabond was applied at all skin incisions, and a sterile suction dressing was applied at the umbilicus.  Foley catheter was removed and he had some scrotal gas, but otherwise tolerated the procedure well without apparent complications.  ______________________________ Lodema Pilot, MD     BL/MEDQ  D:  11/02/2012  T:  11/02/2012  Job:  [id number]

## 2012-11-20 ENCOUNTER — Encounter (INDEPENDENT_AMBULATORY_CARE_PROVIDER_SITE_OTHER): Payer: Self-pay | Admitting: General Surgery

## 2012-11-20 ENCOUNTER — Ambulatory Visit (INDEPENDENT_AMBULATORY_CARE_PROVIDER_SITE_OTHER): Payer: Managed Care, Other (non HMO) | Admitting: General Surgery

## 2012-11-20 ENCOUNTER — Ambulatory Visit (INDEPENDENT_AMBULATORY_CARE_PROVIDER_SITE_OTHER): Payer: Managed Care, Other (non HMO) | Admitting: Family Medicine

## 2012-11-20 ENCOUNTER — Encounter: Payer: Self-pay | Admitting: Family Medicine

## 2012-11-20 VITALS — BP 122/68 | HR 76 | Temp 98.6°F | Resp 15 | Ht 71.0 in | Wt 232.4 lb

## 2012-11-20 VITALS — BP 134/79 | HR 80 | Temp 98.0°F | Ht 71.0 in | Wt 232.0 lb

## 2012-11-20 DIAGNOSIS — E785 Hyperlipidemia, unspecified: Secondary | ICD-10-CM

## 2012-11-20 DIAGNOSIS — Z4889 Encounter for other specified surgical aftercare: Secondary | ICD-10-CM

## 2012-11-20 DIAGNOSIS — E669 Obesity, unspecified: Secondary | ICD-10-CM

## 2012-11-20 DIAGNOSIS — E119 Type 2 diabetes mellitus without complications: Secondary | ICD-10-CM

## 2012-11-20 DIAGNOSIS — R5381 Other malaise: Secondary | ICD-10-CM | POA: Insufficient documentation

## 2012-11-20 DIAGNOSIS — M109 Gout, unspecified: Secondary | ICD-10-CM

## 2012-11-20 DIAGNOSIS — E8881 Metabolic syndrome: Secondary | ICD-10-CM

## 2012-11-20 DIAGNOSIS — Z5189 Encounter for other specified aftercare: Secondary | ICD-10-CM

## 2012-11-20 DIAGNOSIS — I10 Essential (primary) hypertension: Secondary | ICD-10-CM

## 2012-11-20 DIAGNOSIS — R5383 Other fatigue: Secondary | ICD-10-CM

## 2012-11-20 MED ORDER — SITAGLIPTIN PHOSPHATE 100 MG PO TABS: 100.0000 mg | ORAL_TABLET | Freq: Every day | ORAL | Status: DC

## 2012-11-20 NOTE — Progress Notes (Signed)
Patient ID: Kirk Brown, male   DOB: 1950-09-22, 62 y.o.   MRN: [id number] SUBJECTIVE: CC: Chief Complaint  Patient presents with  . Follow-up    reck 6 week had hernia surgery doing well .states metformin made hinm nauseaed  . Medication Refill    mail order rx needed    HPI: Patient is here for follow up of Diabetes Mellitus/HTN/HLD/Gout Symptoms of DM: Denies Nocturia ,Denies Urinary Frequency , denies Blurred vision ,deniesDizziness,denies.Dysuria,denies paresthesias, denies extremity pain or ulcers.Marland Kitchendenies chest pain. has had an annual eye exam. do check the feet. Does check CBGs. Average CBG: Denies episodes of hypoglycemia. Does have an emergency hypoglycemic plan. admits toCompliance with medications. Denies Problems with medications.  Breakfast:nothing Lunch: sandwich Dinner:steak, chicken.  Exercise: mows yard daily  Recovered well from the hernia  Surgery.   Past Medical History  Diagnosis Date  . Gout   . Hypertension   . Hyperlipemia   . RLS (restless legs syndrome)   . Diabetes mellitus without complication   . Wears glasses   . Arthritis    Past Surgical History  Procedure Laterality Date  . Colonoscopy    . Carpal tunnel release      both hands  . Tonsillectomy    . Shoulder arthroscopy      lt  . Elbow arthroscopy      both  . Ankle arthroscopy with arthrodesis  03/12/2012    Procedure: ANKLE ARTHROSCOPY WITH ARTHRODESIS;  Surgeon: Toni Arthurs, MD;  Location: [facility];  Service: Orthopedics;  Laterality: Right;  Right Ankle Arthroscopy with Arthrodesis and  Bone Marrow Aspiration Right Hip (BMAC)  . Right fracture       right leg compound fracture-age 33  . Laparoscopy N/A 11/02/2012    Procedure: LAPAROSCOPY DIAGNOSTIC  bilateral inguinal hernia ;  Surgeon: Lodema Pilot, DO;  Location: [facility];  Service: General;  Laterality: N/A;  . Umbilical hernia repair N/A 11/02/2012    Procedure: HERNIA REPAIR UMBILICAL ADULT;  Surgeon:  Lodema Pilot, DO;  Location: [facility];  Service: General;  Laterality: N/A;  . Insertion of mesh N/A 11/02/2012    Procedure: INSERTION OF MESH;  Surgeon: Lodema Pilot, DO;  Location: [facility];  Service: General;  Laterality: N/A;   History   Social History  . Marital Status: Married    Spouse Name: N/A    Number of Children: N/A  . Years of Education: N/A   Occupational History  . Not on file.   Social History Main Topics  . Smoking status: Former Smoker    Types: Cigarettes    Quit date: 03/09/1989  . Smokeless tobacco: Never Used  . Alcohol Use: 1.8 oz/week    3 Cans of beer per week     Comment: beer  . Drug Use: No  . Sexually Active: Not on file   Other Topics Concern  . Not on file   Social History Narrative  . No narrative on file   Family History  Problem Relation Age of Onset  . Kidney Stones Mother   . Hyperlipidemia Mother   . Diabetes Mother   . Alzheimer's disease Mother   . Kidney Stones Sister    Current Outpatient Prescriptions on File Prior to Visit  Medication Sig Dispense Refill  . aspirin 325 MG tablet Take 325 mg by mouth daily.      Marland Kitchen atorvastatin (LIPITOR) 40 MG tablet Take 40 mg by mouth every evening.      Marland Kitchen lisinopril (PRINIVIL,ZESTRIL)  20 MG tablet Take 20 mg by mouth every evening.      . pramipexole (MIRAPEX) 1.5 MG tablet Take 1.5 mg by mouth at bedtime.      Marland Kitchen allopurinol (ZYLOPRIM) 300 MG tablet Take 300 mg by mouth every evening.      Marland Kitchen HYDROcodone-acetaminophen (NORCO) 5-325 MG per tablet Take 1 tablet by mouth every 4 (four) hours as needed for pain.  45 tablet  0  . indomethacin (INDOCIN) 50 MG capsule Take 50 mg by mouth daily as needed (for gout).      . metFORMIN (GLUCOPHAGE) 500 MG tablet Take 1 tablet (500 mg total) by mouth 2 (two) times daily with a meal.  60 tablet  3   No current facility-administered medications on file prior to visit.   No Known Allergies  There is no immunization history on file for this patient. Prior  to Admission medications   Medication Sig Start Date End Date Taking? Authorizing Provider  aspirin 325 MG tablet Take 325 mg by mouth daily.   Yes Historical Provider, MD  atorvastatin (LIPITOR) 40 MG tablet Take 40 mg by mouth every evening.   Yes Historical Provider, MD  lisinopril (PRINIVIL,ZESTRIL) 20 MG tablet Take 20 mg by mouth every evening.   Yes Historical Provider, MD  pramipexole (MIRAPEX) 1.5 MG tablet Take 1.5 mg by mouth at bedtime.   Yes Historical Provider, MD  allopurinol (ZYLOPRIM) 300 MG tablet Take 300 mg by mouth every evening.    Historical Provider, MD  HYDROcodone-acetaminophen (NORCO) 5-325 MG per tablet Take 1 tablet by mouth every 4 (four) hours as needed for pain. 11/02/12   Lodema Pilot, DO  indomethacin (INDOCIN) 50 MG capsule Take 50 mg by mouth daily as needed (for gout).    Historical Provider, MD  metFORMIN (GLUCOPHAGE) 500 MG tablet Take 1 tablet (500 mg total) by mouth 2 (two) times daily with a meal. 10/08/12   Ileana Ladd, MD    ROS: As above in the HPI. All other systems are stable or negative.  OBJECTIVE: APPEARANCE:  Patient in no acute distress.The patient appeared well nourished and normally developed. Acyanotic. Waist: VITAL SIGNS:BP 134/79  Pulse 80  Temp(Src) 98 F (36.7 C) (Oral)  Ht 5\' 11"  (1.803 m)  Wt 232 lb (105.235 kg)  BMI 32.37 kg/m2 WM Central obesity  SKIN: warm and  Dry without overt rashes, tattoos and scars  HEAD and Neck: without JVD, Head and scalp: normal Eyes:No scleral icterus. Fundi normal, eye movements normal. Ears: Auricle normal, canal normal, Tympanic membranes normal, insufflation normal. Nose: normal Throat: normal Neck & thyroid: normal  CHEST & LUNGS: Chest wall: normal Lungs: Clear  CVS: Reveals the PMI to be normally located. Regular rhythm, First and Second Heart sounds are normal,  absence of murmurs, rubs or gallops. Peripheral vasculature: Radial pulses: normal Dorsal pedis pulses:  normal Posterior pulses: normal  ABDOMEN:  Appearance: obesity. Surgical scars healing Benign, no organomegaly, no masses, no Abdominal Aortic enlargement. No Guarding , no rebound. No Bruits. Bowel sounds: normal  RECTAL: N/A GU: N/A  EXTREMETIES: nonedematous. Both Femoral and Pedal pulses are normal.  MUSCULOSKELETAL:  Spine: normal Joints: intact  NEUROLOGIC: oriented to time,place and person; nonfocal. Strength is normal Sensory is normal Reflexes are normal Cranial Nerves are normal.  Results for orders placed during the hospital encounter of 11/02/12  GLUCOSE, CAPILLARY      Result Value Range   Glucose-Capillary 186 (*) 70 - 99 mg/dL  Comment 1 Documented in Chart    GLUCOSE, CAPILLARY      Result Value Range   Glucose-Capillary 163 (*) 70 - 99 mg/dL   Comment 1 Documented in Chart     Comment 2 Notify RN    GLUCOSE, CAPILLARY      Result Value Range   Glucose-Capillary 152 (*) 70 - 99 mg/dL    ASSESSMENT: DM (diabetes mellitus) - Plan: sitaGLIPtin (JANUVIA) 100 MG tablet  HTN (hypertension)  Other and unspecified hyperlipidemia - Plan: NMR Lipoprofile with Lipids  Gout - Plan: Uric acid  Obesity, unspecified  Metabolic syndrome  Other malaise and fatigue  PLAN:  Orders Placed This Encounter  Procedures  . NMR Lipoprofile with Lipids  . Uric acid   Meds ordered this encounter  Medications  . sitaGLIPtin (JANUVIA) 100 MG tablet    Sig: Take 1 tablet (100 mg total) by mouth daily.    Dispense:  30 tablet    Refill:  5         Dr Woodroe Mode Recommendations  Diet and Exercise discussed with patient.  For nutrition information, I recommend books:  1).Eat to Live by Dr Monico Hoar. 2).Prevent and Reverse Heart Disease by Dr Suzzette Righter. 3) Dr Katherina Right Book:  Program to Reverse Diabetes  Exercise recommendations are:  If unable to walk, then the patient can exercise in a chair 3 times a day. By flapping arms like a  bird gently and raising legs outwards to the front.  If ambulatory, the patient can go for walks for 30 minutes 3 times a week. Then increase the intensity and duration as tolerated.  Goal is to try to attain exercise frequency to 5 times a week.  If applicable: Best to perform resistance exercises (machines or weights) 2 days a week and cardio type exercises 3 days per week.  Return in about 3 months (around 02/20/2013) for Recheck medical problems.  Gavriella Hearst P. Modesto Charon, M.D.

## 2012-11-20 NOTE — Progress Notes (Signed)
Subjective:     Patient ID: Kirk Brown, male   DOB: 26-Oct-1950, 62 y.o.   MRN: [id number]  HPI This patient returns today to have weeks status post laparoscopic bilateral inguinal hernia repair with mesh and umbilical hernia repair. He seems to doing very well at this point. He has some mild tenderness in the scrotum with prolonged sitting but otherwise is asking when he can return to the gym and to normal activities. He says that he is off pain medications and tolerating regular diet. He really has no other complaints.  Review of Systems     Objective:   Physical Exam He is in no acute distress and nontoxic-appearing His incisions are healing nicely without any sign of infection. There is no evidence of recurrent hernia bilaterally or at the umbilicus with Valsalva. He has some thickening of his right cord which I think is expected at this point postoperatively.    Assessment:     Status post laparoscopic bilateral inguinal hernia repair and umbilical hernia repairs  He seems to be recovering very nicely. There is no evidence of any postoperative complication or recurrent hernia. We reviewed the postoperative expectations and limitations and I would expect him to continue with light duty for another 2 weeks and then at that time he should be able to gradually increase his activity as tolerated    Plan:     2 weeks of light duty and then increase activity as tolerated and he can followup with me when necessary

## 2012-11-20 NOTE — Patient Instructions (Signed)
      Dr Jairo Bellew's Recommendations  Diet and Exercise discussed with patient.  For nutrition information, I recommend books:  1).Eat to Live by Dr Joel Fuhrman. 2).Prevent and Reverse Heart Disease by Dr Caldwell Esselstyn. 3) Dr Neal Barnard's Book:  Program to Reverse Diabetes  Exercise recommendations are:  If unable to walk, then the patient can exercise in a chair 3 times a day. By flapping arms like a bird gently and raising legs outwards to the front.  If ambulatory, the patient can go for walks for 30 minutes 3 times a week. Then increase the intensity and duration as tolerated.  Goal is to try to attain exercise frequency to 5 times a week.  If applicable: Best to perform resistance exercises (machines or weights) 2 days a week and cardio type exercises 3 days per week.  

## 2012-11-21 LAB — URIC ACID: Uric Acid, Serum: 6 mg/dL (ref 4.0–6.0)

## 2012-11-23 LAB — NMR LIPOPROFILE WITH LIPIDS
Cholesterol, Total: 157 mg/dL (ref <200)
HDL Particle Number: 33.2 umol/L (ref >=30.5)
HDL Size: 8.6 nm — ABNORMAL LOW (ref >=9.2)
HDL-C: 44 mg/dL (ref >=40)
LDL (calc): 72 mg/dL (ref <100)
LDL Particle Number: 1184 nmol/L — ABNORMAL HIGH (ref <1000)
LDL Size: 20.7 nm (ref >20.5)
LP-IR Score: 89 — ABNORMAL HIGH (ref <=45)
Large HDL-P: 2.2 umol/L — ABNORMAL LOW (ref >=4.8)
Large VLDL-P: 11.2 nmol/L — ABNORMAL HIGH (ref <=2.7)
Small LDL Particle Number: 405 nmol/L (ref <=527)
Triglycerides: 207 mg/dL — ABNORMAL HIGH (ref <150)
VLDL Size: 57.2 nm — ABNORMAL HIGH (ref <=46.6)

## 2012-11-25 ENCOUNTER — Telehealth: Payer: Self-pay | Admitting: Family Medicine

## 2012-11-25 NOTE — Telephone Encounter (Signed)
Taken care of in labs

## 2012-11-25 NOTE — Progress Notes (Signed)
Quick Note:  Lab result close to goal. Add omega-3 fish oil 2gm twice a day to reduce the triglycerides. Reduce the sugars , avoid juices in diet. No change in Other Medications for now. No Change in plans for follow up. ______

## 2012-12-04 ENCOUNTER — Other Ambulatory Visit: Payer: Self-pay | Admitting: *Deleted

## 2012-12-04 ENCOUNTER — Encounter: Payer: Self-pay | Admitting: Family Medicine

## 2012-12-07 ENCOUNTER — Telehealth: Payer: Self-pay | Admitting: Family Medicine

## 2012-12-07 MED ORDER — PRAMIPEXOLE DIHYDROCHLORIDE 1.5 MG PO TABS: 1.5000 mg | ORAL_TABLET | Freq: Every day | ORAL | Status: DC

## 2012-12-08 ENCOUNTER — Other Ambulatory Visit: Payer: Self-pay | Admitting: Family Medicine

## 2012-12-08 DIAGNOSIS — E119 Type 2 diabetes mellitus without complications: Secondary | ICD-10-CM

## 2012-12-08 DIAGNOSIS — E8881 Metabolic syndrome: Secondary | ICD-10-CM

## 2012-12-08 DIAGNOSIS — R739 Hyperglycemia, unspecified: Secondary | ICD-10-CM

## 2012-12-08 DIAGNOSIS — R5383 Other fatigue: Secondary | ICD-10-CM

## 2012-12-08 DIAGNOSIS — R5381 Other malaise: Secondary | ICD-10-CM

## 2012-12-08 DIAGNOSIS — E785 Hyperlipidemia, unspecified: Secondary | ICD-10-CM

## 2012-12-08 DIAGNOSIS — I10 Essential (primary) hypertension: Secondary | ICD-10-CM

## 2012-12-08 DIAGNOSIS — M109 Gout, unspecified: Secondary | ICD-10-CM

## 2012-12-08 MED ORDER — ALLOPURINOL 300 MG PO TABS: 300.0000 mg | ORAL_TABLET | Freq: Every evening | ORAL | Status: DC

## 2012-12-08 MED ORDER — PRAMIPEXOLE DIHYDROCHLORIDE 1.5 MG PO TABS: 1.5000 mg | ORAL_TABLET | Freq: Every day | ORAL | Status: DC

## 2012-12-08 MED ORDER — ATORVASTATIN CALCIUM 40 MG PO TABS: 40.0000 mg | ORAL_TABLET | Freq: Every evening | ORAL | Status: DC

## 2012-12-08 MED ORDER — LISINOPRIL 20 MG PO TABS: 20.0000 mg | ORAL_TABLET | Freq: Every evening | ORAL | Status: DC

## 2012-12-08 NOTE — Telephone Encounter (Signed)
Completed.

## 2012-12-10 ENCOUNTER — Other Ambulatory Visit: Payer: Self-pay | Admitting: Family Medicine

## 2012-12-10 DIAGNOSIS — E785 Hyperlipidemia, unspecified: Secondary | ICD-10-CM

## 2012-12-10 DIAGNOSIS — I10 Essential (primary) hypertension: Secondary | ICD-10-CM

## 2012-12-10 DIAGNOSIS — M62838 Other muscle spasm: Secondary | ICD-10-CM

## 2012-12-10 DIAGNOSIS — M109 Gout, unspecified: Secondary | ICD-10-CM

## 2012-12-10 MED ORDER — LISINOPRIL 20 MG PO TABS: 20.0000 mg | ORAL_TABLET | Freq: Every evening | ORAL | Status: DC

## 2012-12-10 MED ORDER — ATORVASTATIN CALCIUM 40 MG PO TABS: 40.0000 mg | ORAL_TABLET | Freq: Every evening | ORAL | Status: DC

## 2012-12-10 MED ORDER — PRAMIPEXOLE DIHYDROCHLORIDE 1.5 MG PO TABS: 1.5000 mg | ORAL_TABLET | Freq: Every day | ORAL | Status: DC

## 2012-12-10 MED ORDER — ALLOPURINOL 300 MG PO TABS: 300.0000 mg | ORAL_TABLET | Freq: Every evening | ORAL | Status: DC

## 2012-12-10 NOTE — Telephone Encounter (Signed)
Done

## 2012-12-11 ENCOUNTER — Other Ambulatory Visit: Payer: Self-pay | Admitting: Family Medicine

## 2012-12-11 NOTE — Telephone Encounter (Signed)
Pt aware rx ready for pick up. 

## 2012-12-16 ENCOUNTER — Other Ambulatory Visit: Payer: Self-pay | Admitting: *Deleted

## 2012-12-29 NOTE — Telephone Encounter (Signed)
Patient was called 12-11-2012 and told that RX's were ready to pickup.

## 2013-02-25 ENCOUNTER — Ambulatory Visit: Payer: Managed Care, Other (non HMO) | Admitting: Family Medicine

## 2013-03-04 ENCOUNTER — Other Ambulatory Visit: Payer: Self-pay

## 2013-10-13 ENCOUNTER — Other Ambulatory Visit: Payer: Self-pay | Admitting: Internal Medicine

## 2013-10-13 DIAGNOSIS — M25561 Pain in right knee: Secondary | ICD-10-CM

## 2013-10-20 ENCOUNTER — Ambulatory Visit
Admission: RE | Admit: 2013-10-20 | Discharge: 2013-10-20 | Disposition: A | Discharge status: Home / Self Care | Payer: Managed Care, Other (non HMO) | Source: Ambulatory Visit | Attending: Internal Medicine | Admitting: Internal Medicine

## 2013-10-20 DIAGNOSIS — M25561 Pain in right knee: Secondary | ICD-10-CM

## 2014-01-20 ENCOUNTER — Ambulatory Visit (INDEPENDENT_AMBULATORY_CARE_PROVIDER_SITE_OTHER): Payer: Managed Care, Other (non HMO) | Admitting: Otolaryngology

## 2014-01-20 DIAGNOSIS — J0100 Acute maxillary sinusitis, unspecified: Secondary | ICD-10-CM

## 2014-01-20 DIAGNOSIS — J310 Chronic rhinitis: Secondary | ICD-10-CM

## 2014-01-20 DIAGNOSIS — J342 Deviated nasal septum: Secondary | ICD-10-CM

## 2014-02-02 ENCOUNTER — Other Ambulatory Visit: Payer: Self-pay | Admitting: Nurse Practitioner

## 2014-02-03 NOTE — Telephone Encounter (Signed)
Last office visit was 11-20-12. Patient of Dr Jacelyn Grip. Please advise

## 2014-02-03 NOTE — Telephone Encounter (Signed)
This prescription is okay for one month. Please schedule the patient an appointment to be seen as soon as possible for ongoing followup

## 2014-02-24 ENCOUNTER — Ambulatory Visit (INDEPENDENT_AMBULATORY_CARE_PROVIDER_SITE_OTHER): Payer: Managed Care, Other (non HMO) | Admitting: Otolaryngology

## 2014-02-24 DIAGNOSIS — J321 Chronic frontal sinusitis: Secondary | ICD-10-CM

## 2014-02-24 DIAGNOSIS — J310 Chronic rhinitis: Secondary | ICD-10-CM

## 2014-02-24 DIAGNOSIS — J320 Chronic maxillary sinusitis: Secondary | ICD-10-CM

## 2014-03-05 ENCOUNTER — Other Ambulatory Visit: Payer: Self-pay | Admitting: Family Medicine

## 2014-03-25 ENCOUNTER — Emergency Department (HOSPITAL_COMMUNITY)
Admission: EM | Admit: 2014-03-25 | Discharge: 2014-03-25 | Disposition: A | Discharge status: Home / Self Care | Payer: Managed Care, Other (non HMO) | Attending: Emergency Medicine | Admitting: Emergency Medicine

## 2014-03-25 ENCOUNTER — Encounter (HOSPITAL_COMMUNITY): Payer: Self-pay

## 2014-03-25 DIAGNOSIS — M1990 Unspecified osteoarthritis, unspecified site: Secondary | ICD-10-CM | POA: Diagnosis not present

## 2014-03-25 DIAGNOSIS — G2581 Restless legs syndrome: Secondary | ICD-10-CM | POA: Insufficient documentation

## 2014-03-25 DIAGNOSIS — Y998 Other external cause status: Secondary | ICD-10-CM | POA: Diagnosis not present

## 2014-03-25 DIAGNOSIS — W57XXXA Bitten or stung by nonvenomous insect and other nonvenomous arthropods, initial encounter: Secondary | ICD-10-CM | POA: Insufficient documentation

## 2014-03-25 DIAGNOSIS — Z87891 Personal history of nicotine dependence: Secondary | ICD-10-CM | POA: Diagnosis not present

## 2014-03-25 DIAGNOSIS — S0096XA Insect bite (nonvenomous) of unspecified part of head, initial encounter: Secondary | ICD-10-CM | POA: Diagnosis present

## 2014-03-25 DIAGNOSIS — L03211 Cellulitis of face: Secondary | ICD-10-CM

## 2014-03-25 DIAGNOSIS — Z79899 Other long term (current) drug therapy: Secondary | ICD-10-CM | POA: Insufficient documentation

## 2014-03-25 DIAGNOSIS — Y9389 Activity, other specified: Secondary | ICD-10-CM | POA: Diagnosis not present

## 2014-03-25 DIAGNOSIS — Z7982 Long term (current) use of aspirin: Secondary | ICD-10-CM | POA: Diagnosis not present

## 2014-03-25 DIAGNOSIS — Z973 Presence of spectacles and contact lenses: Secondary | ICD-10-CM | POA: Insufficient documentation

## 2014-03-25 DIAGNOSIS — E119 Type 2 diabetes mellitus without complications: Secondary | ICD-10-CM | POA: Insufficient documentation

## 2014-03-25 DIAGNOSIS — Y9289 Other specified places as the place of occurrence of the external cause: Secondary | ICD-10-CM | POA: Diagnosis not present

## 2014-03-25 DIAGNOSIS — I10 Essential (primary) hypertension: Secondary | ICD-10-CM | POA: Diagnosis not present

## 2014-03-25 MED ORDER — CEPHALEXIN 500 MG PO CAPS: 500.0000 mg | ORAL_CAPSULE | Freq: Four times a day (QID) | ORAL | Status: DC

## 2014-03-25 MED ORDER — MUPIROCIN 2 % EX OINT: TOPICAL_OINTMENT | CUTANEOUS | Status: DC

## 2014-03-25 NOTE — Discharge Instructions (Signed)
Please return to the ED for the reasons we discussed. Follow up in 1-2 days for a wound check. Please take all of your antibiotics until finished!   You may develop abdominal discomfort or diarrhea from the antibiotic.  You may help offset this with probiotics which you can buy or get in yogurt. Do not eat  or take the probiotics until 2 hours after your antibiotic.   Cellulitis Cellulitis is an infection of the skin and the tissue beneath it. The infected area is usually red and tender. Cellulitis occurs most often in the arms and lower legs.  CAUSES  Cellulitis is caused by bacteria that enter the skin through cracks or cuts in the skin. The most common types of bacteria that cause cellulitis are staphylococci and streptococci. SIGNS AND SYMPTOMS   Redness and warmth.  Swelling.  Tenderness or pain.  Fever. DIAGNOSIS  Your health care provider can usually determine what is wrong based on a physical exam. Blood tests may also be done. TREATMENT  Treatment usually involves taking an antibiotic medicine. HOME CARE INSTRUCTIONS   Take your antibiotic medicine as directed by your health care provider. Finish the antibiotic even if you start to feel better.  Keep the infected arm or leg elevated to reduce swelling.  Apply a warm cloth to the affected area up to 4 times per day to relieve pain.  Take medicines only as directed by your health care provider.  Keep all follow-up visits as directed by your health care provider. SEEK MEDICAL CARE IF:   You notice red streaks coming from the infected area.  Your red area gets larger or turns dark in color.  Your bone or joint underneath the infected area becomes painful after the skin has healed.  Your infection returns in the same area or another area.  You notice a swollen bump in the infected area.  You develop new symptoms.  You have a fever. SEEK IMMEDIATE MEDICAL CARE IF:   You feel very sleepy.  You develop vomiting or  diarrhea.  You have a general ill feeling (malaise) with muscle aches and pains. MAKE SURE YOU:   Understand these instructions.  Will watch your condition.  Will get help right away if you are not doing well or get worse. Document Released: 01/23/2005 Document Revised: 08/30/2013 Document Reviewed: 07/01/2011 [facility] Patient Information 2015 [place], [place]. This information is not intended to replace advice given to you by your health care provider. Make sure you discuss any questions you have with your health care provider.

## 2014-03-25 NOTE — ED Notes (Signed)
Pt has insect bite to left forehead x 4 days. States pain, swelling and "pressure behind left eye" is progressively increasing. Denies any visual changes. Reports mild drainage from area. Denies fever/chills.

## 2014-03-25 NOTE — ED Provider Notes (Signed)
CSN: [id number]     Arrival date & time 03/25/14  3646 History  This chart was scribed for non-physician practitioner, Margarita Mail, PA-C working with Malvin Johns, MD by Einar Pheasant, ED scribe. This patient was seen in room B16C/B16C and the patient's care was started at 9:37 AM.     Chief Complaint  Patient presents with  . Insect Bite   The history is provided by the patient. No language interpreter was used.   HPI Comments: Kirk Brown is a 63 y.o. male with a hx of DM without complications presents to the Emergency Department complaining of an insect bite to the left side of his forehead that occurred 4 days ago in [place]. Pt thinks that it is a spider bite. He is complaining of associated pain and left eye pain. Pt states that the eye pain feels like a "pressure" behind his eye. He states that the pain is exacerbated by moving his eye left to right. Pt endorses associated drainage from the bite area, that he describes as being clear in nature. Kirk Brown does not recall the date of his last tetanus vaccine. Pt endorses a synus infection approximately 1 month ago for which he was treated with Levoquin. Pt states that he had an allergic reaction to the Levoquin. Denies any fever, chills, nausea, emesis, abdominal pain, hx or MRSA infections, other medicinal allergies, or SOB.  Past Medical History  Diagnosis Date  . Gout   . Hypertension   . Hyperlipemia   . RLS (restless legs syndrome)   . Diabetes mellitus without complication   . Wears glasses   . Arthritis    Past Surgical History  Procedure Laterality Date  . Colonoscopy    . Carpal tunnel release      both hands  . Tonsillectomy    . Shoulder arthroscopy      lt  . Elbow arthroscopy      both  . Ankle arthroscopy with arthrodesis  03/12/2012    Procedure: ANKLE ARTHROSCOPY WITH ARTHRODESIS;  Surgeon: Wylene Simmer, MD;  Location: [place];  Service: Orthopedics;  Laterality: Right;  Right Ankle  Arthroscopy with Arthrodesis and  Bone Marrow Aspiration Right Hip (BMAC)  . Right fracture       right leg compound fracture-age 47  . Laparoscopy N/A 11/02/2012    Procedure: LAPAROSCOPY DIAGNOSTIC  bilateral inguinal hernia ;  Surgeon: Madilyn Hook, DO;  Location: [facility];  Service: General;  Laterality: N/A;  . Umbilical hernia repair N/A 11/02/2012    Procedure: HERNIA REPAIR UMBILICAL ADULT;  Surgeon: Madilyn Hook, DO;  Location: [facility];  Service: General;  Laterality: N/A;  . Insertion of mesh N/A 11/02/2012    Procedure: INSERTION OF MESH;  Surgeon: Madilyn Hook, DO;  Location: [facility];  Service: General;  Laterality: N/A;   Family History  Problem Relation Age of Onset  . Kidney Stones Mother   . Hyperlipidemia Mother   . Diabetes Mother   . Alzheimer's disease Mother   . Kidney Stones Sister    History  Substance Use Topics  . Smoking status: Former Smoker    Types: Cigarettes    Quit date: 03/09/1989  . Smokeless tobacco: Never Used  . Alcohol Use: 1.8 oz/week    3 Cans of beer per week     Comment: beer    Review of Systems  Constitutional: Negative for fever and chills.  Respiratory: Negative for shortness of breath.   Gastrointestinal: Negative for abdominal pain.  Skin: Positive for color change (insect bite) and wound (insect bite).   Allergies  Review of patient's allergies indicates no known allergies.  Home Medications   Prior to Admission medications   Medication Sig Start Date End Date Taking? Authorizing Provider  allopurinol (ZYLOPRIM) 300 MG tablet Take 1 tablet (300 mg total) by mouth every evening. 12/10/12   Vernie Shanks, MD  aspirin 325 MG tablet Take 325 mg by mouth daily.    Historical Provider, MD  atorvastatin (LIPITOR) 40 MG tablet Take 1 tablet (40 mg total) by mouth every evening. 12/10/12   Vernie Shanks, MD  HYDROcodone-acetaminophen (NORCO) 5-325 MG per tablet Take 1 tablet by mouth every 4 (four) hours as needed for pain. 11/02/12   Madilyn Hook, DO  indomethacin (INDOCIN) 50 MG capsule Take 50 mg by mouth daily as needed (for gout).    Historical Provider, MD  lisinopril (PRINIVIL,ZESTRIL) 20 MG tablet Take 1 tablet (20 mg total) by mouth every evening. 12/10/12   Vernie Shanks, MD  pramipexole (MIRAPEX) 1.5 MG tablet TAKE 1 TABLET BY MOUTH AT BEDTIME 03/07/14   Chipper Herb, MD  sitaGLIPtin (JANUVIA) 100 MG tablet Take 1 tablet (100 mg total) by mouth daily. 11/20/12   Vernie Shanks, MD   Triage Vitals:BP 142/85 mmHg  Pulse 74  Temp(Src) 97.6 F (36.4 C) (Oral)  Resp 18  SpO2 99%   Physical Exam  Constitutional: He appears well-developed and well-nourished. No distress.  HENT:  Head: Normocephalic and atraumatic.  Eyes: Conjunctivae and EOM are normal. Right eye exhibits no discharge. Left eye exhibits no discharge.  Pain with abduction of the left eye. There is no swelling or redness around the left eye. No tenderness to palpation. No other signs of orbital or preseptal cellulitis.  Neck: Normal range of motion.  Cardiovascular: Normal rate, regular rhythm and normal heart sounds.  Exam reveals no gallop and no friction rub.   No murmur heard. Pulmonary/Chest: Effort normal and breath sounds normal. No respiratory distress.  Abdominal: Soft. He exhibits no distension. There is no tenderness.  Musculoskeletal: Normal range of motion.  Lymphadenopathy:       Head (left side): Preauricular adenopathy present.  Neurological: He is alert.  Skin: Skin is warm and dry.  3.0 cm of induration to left forehead with central scab which is tender to palpation. No streaking. No Fluctuates.   Psychiatric: He has a normal mood and affect. His behavior is normal. Thought content normal.  Nursing note and vitals reviewed.   ED Course  Procedures (including critical care time)  DIAGNOSTIC STUDIES: Oxygen Saturation is 99% on RA, normal by my interpretation.    COORDINATION OF CARE: 9:51 AM- Advised pt to follow up with UC in  approximately 24-48 hours. Pt advised of plan for treatment and pt agrees.   MDM   Final diagnoses:  Cellulitis of face   Patient with cellulitis. No signs of orbital or preseptal cellulitis. Afebrile. No oral or nasal lesions suggestive or mucormycosis. Patient will be discharged with keflex. Return for wound check in 24-48 hours. I have discussed the case with Dr. Tamera Punt and the patient who agree with POC. The patient appears reasonably screened and/or stabilized for discharge and I doubt any other medical condition or other [facility] requiring further screening, evaluation, or treatment in the ED at this time prior to discharge.   I personally performed the services described in this documentation, which was scribed in my presence. The recorded  information has been reviewed and is accurate.    Margarita Mail, PA-C 03/25/14 [place], MD 03/26/14 [phone]

## 2014-03-28 ENCOUNTER — Emergency Department (HOSPITAL_COMMUNITY)
Admission: EM | Admit: 2014-03-28 | Discharge: 2014-03-28 | Disposition: A | Discharge status: Home / Self Care | Payer: Managed Care, Other (non HMO) | Source: Home / Self Care | Attending: Emergency Medicine | Admitting: Emergency Medicine

## 2014-03-28 ENCOUNTER — Encounter (HOSPITAL_COMMUNITY): Payer: Self-pay | Admitting: Emergency Medicine

## 2014-03-28 DIAGNOSIS — T148 Other injury of unspecified body region: Secondary | ICD-10-CM

## 2014-03-28 DIAGNOSIS — W57XXXA Bitten or stung by nonvenomous insect and other nonvenomous arthropods, initial encounter: Secondary | ICD-10-CM

## 2014-03-28 MED ORDER — CEFTRIAXONE SODIUM 1 G IJ SOLR
INTRAMUSCULAR | Status: AC
  Filled 2014-03-28: qty 10

## 2014-03-28 MED ORDER — OXYCODONE-ACETAMINOPHEN 5-325 MG PO TABS
ORAL_TABLET | ORAL | Status: DC
Start: 2014-03-28 — End: 2019-01-11

## 2014-03-28 MED ORDER — CEFTRIAXONE SODIUM 1 G IJ SOLR
1.0000 g | Freq: Once | INTRAMUSCULAR | Status: AC
  Administered 2014-03-28: 1 g via INTRAMUSCULAR

## 2014-03-28 MED ORDER — LIDOCAINE HCL (PF) 1 % IJ SOLN
INTRAMUSCULAR | Status: AC
  Filled 2014-03-28: qty 5

## 2014-03-28 MED ORDER — CLINDAMYCIN HCL 300 MG PO CAPS: 300.0000 mg | ORAL_CAPSULE | Freq: Four times a day (QID) | ORAL | Status: DC

## 2014-03-28 NOTE — Discharge Instructions (Signed)

## 2014-03-28 NOTE — ED Notes (Signed)
Redness and swelling left frontal area X 10 days

## 2014-03-28 NOTE — ED Provider Notes (Signed)
  Chief Complaint    No chief complaint on file.   History of Present Illness      Kirk Brown is a 63 year old male who has had a 5 to six-day history of a painful, necrotic papule on his left forehead that appeared spontaneously. He did not see anything biting him. He attributes it to a bug bite. It's painful, but not been draining any purulent drainage. The forehead feels numb and his eyes sore, but there is no surrounding erythema or swelling. There is no swelling of the eyelid or redness in the eye, no loss of vision. He denies any fever or chills. He was seen in the emergency room 3 days ago and placed on cephalexin and mupirocin but does not feel like he's getting any better.  Review of Systems   Other than as noted above, the patient denies any of the following symptoms: Systemic:  No fever or chills. ENT:  No nasal congestion, rhinorrhea, sore throat, swelling of lips, tongue or throat. Resp:  No cough, wheezing, or shortness of breath.  [place]    Past medical history, family history, social history, meds, and allergies were reviewed. He is allergic to Levaquin. He has diabetes, hypertension, hypercholesterolemia, and restless leg syndrome. He takes Lipitor, lisinopril, hydrocodone, and the.  Physical Exam     Vital signs:  BP 131/84 mmHg  Pulse 67  Temp(Src) 98.1 F (36.7 C) (Oral)  Resp 16  SpO2 97% Gen:  Alert, oriented, in no distress. ENT:  Pharynx clear, no intraoral lesions, moist mucous membranes. Lungs:  Clear to auscultation. Skin:  There is an elliptical 12 by 55mm necrotic lesion on the left forehead. There is no surrounding erythema or swelling. It's tender to palpation over the entire left forehead. There are no vesicles and no drainage. There is nothing to culture. The eye appears normal with no swelling of the eyelids or erythema.      Course in [facility] [place]     The following meds were given:  Medications  cefTRIAXone (ROCEPHIN) injection 1 g  (not administered)   Assessment    The encounter diagnosis was Insect bite.  Differential diagnosis includes spider bite, infected insect bite, or small area of shingles. There is no evidence of surrounding cellulitis. Does not need to go to the emergency room today. I'm going to change his antibiotics and have him stop the cephalexin switched to clindamycin. Return in 48 hours. If no better consider transfer to the emergency department for IV antibiotics.  Plan     1.  Meds:  The following meds were prescribed:   New Prescriptions   CLINDAMYCIN (CLEOCIN) 300 MG CAPSULE    Take 1 capsule (300 mg total) by mouth 4 (four) times daily.   OXYCODONE-ACETAMINOPHEN (PERCOCET) 5-325 MG PER TABLET    1 to 2 tablets every 6 hours as needed for pain.    2.  Patient Education/Counseling:  The patient was given appropriate handouts, self care instructions, and instructed in symptomatic relief.    3.  Follow up:  The patient was told to follow up here if no better in 3 to 4 days, or sooner if becoming worse in any way, and given some red flag symptoms such as worsening rash, fever, or difficulty breathing which would prompt immediate return.  Follow up here if necessary.      Harden Mo, MD 03/28/14 1100

## 2014-03-31 ENCOUNTER — Ambulatory Visit (INDEPENDENT_AMBULATORY_CARE_PROVIDER_SITE_OTHER): Payer: Managed Care, Other (non HMO) | Admitting: Otolaryngology

## 2014-03-31 DIAGNOSIS — J310 Chronic rhinitis: Secondary | ICD-10-CM

## 2014-03-31 DIAGNOSIS — J342 Deviated nasal septum: Secondary | ICD-10-CM

## 2014-03-31 DIAGNOSIS — J343 Hypertrophy of nasal turbinates: Secondary | ICD-10-CM

## 2014-04-20 ENCOUNTER — Ambulatory Visit: Payer: Managed Care, Other (non HMO) | Attending: Internal Medicine | Admitting: Physical Therapy

## 2014-04-20 DIAGNOSIS — M25572 Pain in left ankle and joints of left foot: Secondary | ICD-10-CM | POA: Insufficient documentation

## 2014-04-20 DIAGNOSIS — M67972 Unspecified disorder of synovium and tendon, left ankle and foot: Secondary | ICD-10-CM | POA: Diagnosis not present

## 2014-04-26 ENCOUNTER — Ambulatory Visit: Payer: Managed Care, Other (non HMO) | Admitting: Physical Therapy

## 2014-04-26 DIAGNOSIS — M25572 Pain in left ankle and joints of left foot: Secondary | ICD-10-CM | POA: Diagnosis not present

## 2014-05-02 ENCOUNTER — Ambulatory Visit: Payer: Managed Care, Other (non HMO) | Attending: Internal Medicine | Admitting: Physical Therapy

## 2014-05-02 DIAGNOSIS — M25572 Pain in left ankle and joints of left foot: Secondary | ICD-10-CM | POA: Insufficient documentation

## 2014-05-02 DIAGNOSIS — M67972 Unspecified disorder of synovium and tendon, left ankle and foot: Secondary | ICD-10-CM | POA: Diagnosis not present

## 2014-05-05 ENCOUNTER — Ambulatory Visit: Payer: Managed Care, Other (non HMO) | Admitting: Physical Therapy

## 2014-05-09 ENCOUNTER — Ambulatory Visit: Payer: Managed Care, Other (non HMO) | Admitting: Physical Therapy

## 2014-05-09 DIAGNOSIS — M25572 Pain in left ankle and joints of left foot: Secondary | ICD-10-CM | POA: Diagnosis not present

## 2014-05-13 ENCOUNTER — Ambulatory Visit: Payer: Managed Care, Other (non HMO) | Admitting: Physical Therapy

## 2014-05-13 DIAGNOSIS — M25572 Pain in left ankle and joints of left foot: Secondary | ICD-10-CM | POA: Diagnosis not present

## 2014-05-17 ENCOUNTER — Ambulatory Visit: Payer: Managed Care, Other (non HMO) | Admitting: Physical Therapy

## 2014-05-17 DIAGNOSIS — M25572 Pain in left ankle and joints of left foot: Secondary | ICD-10-CM | POA: Diagnosis not present

## 2014-05-19 ENCOUNTER — Encounter: Payer: Managed Care, Other (non HMO) | Admitting: Physical Therapy

## 2014-10-11 ENCOUNTER — Emergency Department (HOSPITAL_COMMUNITY)
Admission: EM | Admit: 2014-10-11 | Discharge: 2014-10-11 | Disposition: A | Discharge status: Home / Self Care | Payer: Managed Care, Other (non HMO) | Attending: Emergency Medicine | Admitting: Emergency Medicine

## 2014-10-11 ENCOUNTER — Encounter (HOSPITAL_COMMUNITY): Payer: Self-pay | Admitting: *Deleted

## 2014-10-11 DIAGNOSIS — S199XXA Unspecified injury of neck, initial encounter: Secondary | ICD-10-CM | POA: Diagnosis present

## 2014-10-11 DIAGNOSIS — Y999 Unspecified external cause status: Secondary | ICD-10-CM | POA: Insufficient documentation

## 2014-10-11 DIAGNOSIS — Y939 Activity, unspecified: Secondary | ICD-10-CM | POA: Insufficient documentation

## 2014-10-11 DIAGNOSIS — M109 Gout, unspecified: Secondary | ICD-10-CM | POA: Insufficient documentation

## 2014-10-11 DIAGNOSIS — G8929 Other chronic pain: Secondary | ICD-10-CM | POA: Diagnosis not present

## 2014-10-11 DIAGNOSIS — S161XXA Strain of muscle, fascia and tendon at neck level, initial encounter: Secondary | ICD-10-CM | POA: Insufficient documentation

## 2014-10-11 DIAGNOSIS — X58XXXA Exposure to other specified factors, initial encounter: Secondary | ICD-10-CM | POA: Diagnosis not present

## 2014-10-11 DIAGNOSIS — Z973 Presence of spectacles and contact lenses: Secondary | ICD-10-CM | POA: Diagnosis not present

## 2014-10-11 DIAGNOSIS — E785 Hyperlipidemia, unspecified: Secondary | ICD-10-CM | POA: Insufficient documentation

## 2014-10-11 DIAGNOSIS — Z79899 Other long term (current) drug therapy: Secondary | ICD-10-CM | POA: Diagnosis not present

## 2014-10-11 DIAGNOSIS — Z720 Tobacco use: Secondary | ICD-10-CM | POA: Insufficient documentation

## 2014-10-11 DIAGNOSIS — Y929 Unspecified place or not applicable: Secondary | ICD-10-CM | POA: Insufficient documentation

## 2014-10-11 DIAGNOSIS — I10 Essential (primary) hypertension: Secondary | ICD-10-CM | POA: Diagnosis not present

## 2014-10-11 DIAGNOSIS — E119 Type 2 diabetes mellitus without complications: Secondary | ICD-10-CM | POA: Diagnosis not present

## 2014-10-11 DIAGNOSIS — M1990 Unspecified osteoarthritis, unspecified site: Secondary | ICD-10-CM | POA: Insufficient documentation

## 2014-10-11 MED ORDER — KETOROLAC TROMETHAMINE 60 MG/2ML IM SOLN
30.0000 mg | Freq: Once | INTRAMUSCULAR | Status: AC
  Administered 2014-10-11: 30 mg via INTRAMUSCULAR
  Filled 2014-10-11: qty 2

## 2014-10-11 MED ORDER — METHOCARBAMOL 500 MG PO TABS: 500.0000 mg | ORAL_TABLET | Freq: Two times a day (BID) | ORAL | Status: AC

## 2014-10-11 MED ORDER — NAPROXEN 500 MG PO TABS: 500.0000 mg | ORAL_TABLET | Freq: Two times a day (BID) | ORAL | Status: AC

## 2014-10-11 NOTE — Discharge Instructions (Signed)

## 2014-10-11 NOTE — ED Notes (Signed)
Patient is alert and orientedx4.  Patient was explained discharge instructions and they understood them with no questions.  The patient's significant other is Bryson Corona is taking the patient.
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
## 2015-02-03 ENCOUNTER — Other Ambulatory Visit: Payer: Self-pay | Admitting: Pain Medicine

## 2015-02-03 DIAGNOSIS — G8929 Other chronic pain: Secondary | ICD-10-CM

## 2015-02-03 DIAGNOSIS — M545 Low back pain, unspecified: Secondary | ICD-10-CM

## 2015-02-24 ENCOUNTER — Ambulatory Visit
Admission: RE | Admit: 2015-02-24 | Discharge: 2015-02-24 | Disposition: A | Discharge status: Home / Self Care | Payer: Managed Care, Other (non HMO) | Source: Ambulatory Visit | Attending: Pain Medicine | Admitting: Pain Medicine

## 2015-02-24 DIAGNOSIS — M545 Low back pain, unspecified: Secondary | ICD-10-CM

## 2015-02-24 DIAGNOSIS — G8929 Other chronic pain: Secondary | ICD-10-CM

## 2015-09-19 ENCOUNTER — Other Ambulatory Visit: Payer: Self-pay | Admitting: Pain Medicine

## 2015-09-19 DIAGNOSIS — M542 Cervicalgia: Secondary | ICD-10-CM

## 2015-09-24 ENCOUNTER — Ambulatory Visit
Admission: RE | Admit: 2015-09-24 | Discharge: 2015-09-24 | Disposition: A | Discharge status: Home / Self Care | Payer: Managed Care, Other (non HMO) | Source: Ambulatory Visit | Attending: Pain Medicine | Admitting: Pain Medicine

## 2015-09-24 DIAGNOSIS — M542 Cervicalgia: Secondary | ICD-10-CM

## 2016-05-20 ENCOUNTER — Other Ambulatory Visit: Payer: Self-pay | Admitting: Internal Medicine

## 2016-05-20 DIAGNOSIS — R945 Abnormal results of liver function studies: Secondary | ICD-10-CM

## 2016-05-27 ENCOUNTER — Ambulatory Visit
Admission: RE | Admit: 2016-05-27 | Discharge: 2016-05-27 | Disposition: A | Discharge status: Home / Self Care | Payer: Managed Care, Other (non HMO) | Source: Ambulatory Visit | Attending: Internal Medicine | Admitting: Internal Medicine

## 2016-05-27 DIAGNOSIS — R945 Abnormal results of liver function studies: Secondary | ICD-10-CM

## 2016-08-07 ENCOUNTER — Other Ambulatory Visit: Payer: Self-pay | Admitting: Internal Medicine

## 2016-08-07 DIAGNOSIS — R42 Dizziness and giddiness: Secondary | ICD-10-CM

## 2016-08-18 ENCOUNTER — Ambulatory Visit
Admission: RE | Admit: 2016-08-18 | Discharge: 2016-08-18 | Disposition: A | Discharge status: Home / Self Care | Payer: Medicare Other | Source: Ambulatory Visit | Attending: Internal Medicine | Admitting: Internal Medicine

## 2016-08-18 DIAGNOSIS — R42 Dizziness and giddiness: Secondary | ICD-10-CM

## 2016-09-24 ENCOUNTER — Institutional Professional Consult (permissible substitution): Payer: Managed Care, Other (non HMO) | Admitting: Neurology

## 2017-03-17 ENCOUNTER — Emergency Department (HOSPITAL_BASED_OUTPATIENT_CLINIC_OR_DEPARTMENT_OTHER): Payer: Medicare Other

## 2017-03-17 ENCOUNTER — Emergency Department (HOSPITAL_BASED_OUTPATIENT_CLINIC_OR_DEPARTMENT_OTHER)
Admission: EM | Admit: 2017-03-17 | Discharge: 2017-03-17 | Disposition: A | Discharge status: Home / Self Care | Payer: Medicare Other | Attending: Emergency Medicine | Admitting: Emergency Medicine

## 2017-03-17 ENCOUNTER — Encounter (HOSPITAL_BASED_OUTPATIENT_CLINIC_OR_DEPARTMENT_OTHER): Payer: Self-pay | Admitting: *Deleted

## 2017-03-17 ENCOUNTER — Other Ambulatory Visit: Payer: Self-pay

## 2017-03-17 DIAGNOSIS — Z7984 Long term (current) use of oral hypoglycemic drugs: Secondary | ICD-10-CM | POA: Diagnosis not present

## 2017-03-17 DIAGNOSIS — W228XXA Striking against or struck by other objects, initial encounter: Secondary | ICD-10-CM | POA: Diagnosis not present

## 2017-03-17 DIAGNOSIS — S8992XA Unspecified injury of left lower leg, initial encounter: Secondary | ICD-10-CM | POA: Diagnosis present

## 2017-03-17 DIAGNOSIS — S80812A Abrasion, left lower leg, initial encounter: Secondary | ICD-10-CM

## 2017-03-17 DIAGNOSIS — F17210 Nicotine dependence, cigarettes, uncomplicated: Secondary | ICD-10-CM | POA: Insufficient documentation

## 2017-03-17 DIAGNOSIS — E119 Type 2 diabetes mellitus without complications: Secondary | ICD-10-CM | POA: Diagnosis not present

## 2017-03-17 DIAGNOSIS — Y929 Unspecified place or not applicable: Secondary | ICD-10-CM | POA: Insufficient documentation

## 2017-03-17 DIAGNOSIS — S8012XA Contusion of left lower leg, initial encounter: Secondary | ICD-10-CM | POA: Diagnosis not present

## 2017-03-17 DIAGNOSIS — Y93H9 Activity, other involving exterior property and land maintenance, building and construction: Secondary | ICD-10-CM | POA: Insufficient documentation

## 2017-03-17 DIAGNOSIS — Y999 Unspecified external cause status: Secondary | ICD-10-CM | POA: Diagnosis not present

## 2017-03-17 DIAGNOSIS — I10 Essential (primary) hypertension: Secondary | ICD-10-CM | POA: Diagnosis not present

## 2017-03-17 DIAGNOSIS — Z79899 Other long term (current) drug therapy: Secondary | ICD-10-CM | POA: Insufficient documentation

## 2017-03-17 HISTORY — DX: Dorsalgia, unspecified: M54.9

## 2017-03-17 HISTORY — DX: Other chronic pain: G89.29

## 2017-03-17 NOTE — ED Triage Notes (Signed)
Injury to his left upper leg. He was cutting a tree and a limb hit him in the thigh. Abrasion noted. Swelling to his upper leg.

## 2017-03-17 NOTE — ED Provider Notes (Signed)
[facility] EMERGENCY DEPARTMENT Provider Note  CSN: [id number] Arrival date & time: 03/17/17 1547  Chief Complaint(s) Fall and Leg Injury  HPI Kirk Brown is a 66 y.o. male who presents with left leg pain after being in hit with a tree branch, while cutting down a tree.  Pain is in the left eye described as throbbing pain exacerbated with palpation and ambulation.  Alleviated by immobilization.  He endorses associated abrasions to the left thigh and knee.  No other injuries.  No other alleviating or aggravating factors.  HPI  Past Medical History Past Medical History:  Diagnosis Date  . Arthritis   . Chronic back pain   . Diabetes mellitus without complication ([place])   . Gout   . Hyperlipemia   . Hypertension   . RLS (restless legs syndrome)   . Wears glasses    Patient Active Problem List   Diagnosis Date Noted  . Other malaise and fatigue 11/20/2012  . Obesity, unspecified 10/08/2012  . Metabolic syndrome 38/18/2993  . DM (diabetes mellitus) ([place]) 10/08/2012  . Hyperglycemia 10/08/2012  . Deep inguinal pain 10/08/2012  . Gout 09/30/2012  . HTN (hypertension) 09/30/2012  . Other and unspecified hyperlipidemia 09/30/2012   Home Medication(s) Prior to Admission medications   Medication Sig Start Date End Date Taking? Authorizing Provider  atorvastatin (LIPITOR) 40 MG tablet Take 1 tablet (40 mg total) by mouth every evening. 12/10/12  Yes Vernie Shanks, MD  fluticasone [facility]) 50 MCG/ACT nasal spray Place 2 sprays into both nostrils daily as needed for allergies or rhinitis.  08/31/14  Yes [provider]  metFORMIN (GLUCOPHAGE) 500 MG tablet Take 500 mg by mouth daily with breakfast.   Yes [provider]  Morphine Sulfate 40 MG CP24 Take by mouth.   Yes [provider]  pramipexole (MIRAPEX) 1.5 MG tablet TAKE 1 TABLET BY MOUTH AT BEDTIME 03/07/14  Yes Chipper Herb, MD  allopurinol (ZYLOPRIM) 300 MG tablet Take 1 tablet (300 mg  total) by mouth every evening. Patient not taking: Reported on 10/11/2014 12/10/12   Vernie Shanks, MD  clindamycin (CLEOCIN) 300 MG capsule Take 1 capsule (300 mg total) by mouth 4 (four) times daily. Patient not taking: Reported on 10/11/2014 03/28/14   Harden Mo, MD  glimepiride (AMARYL) 4 MG tablet Take 4 mg by mouth at bedtime.    [provider]  HYDROcodone-acetaminophen (NORCO) 10-325 MG per tablet Take 1 tablet by mouth every 6 (six) hours as needed for moderate pain or severe pain.    [provider]  HYDROcodone-acetaminophen (NORCO) 5-325 MG per tablet Take 1 tablet by mouth every 4 (four) hours as needed for pain. Patient not taking: Reported on 10/11/2014 11/02/12   Madilyn Hook, DO  lisinopril (PRINIVIL,ZESTRIL) 20 MG tablet Take 1 tablet (20 mg total) by mouth every evening. 12/10/12   Vernie Shanks, MD  mupirocin ointment Drue Stager) 2 % Apply to the affected area twice daily for 7 days Patient not taking: Reported on 10/11/2014 03/25/14   Margarita Mail, PA-C  oxyCODONE-acetaminophen (PERCOCET) 5-325 MG per tablet 1 to 2 tablets every 6 hours as needed for pain. Patient not taking: Reported on 10/11/2014 03/28/14   Harden Mo, MD  Past Surgical History Past Surgical History:  Procedure Laterality Date  . ANKLE ARTHROSCOPY WITH ARTHRODESIS Right 03/12/2012   Performed by Wylene Simmer, MD at [facility]  . CARPAL TUNNEL RELEASE     both hands  . COLONOSCOPY    . ELBOW ARTHROSCOPY     both  . HERNIA REPAIR UMBILICAL ADULT N/A 04/30/2480   Performed by Madilyn Hook, DO at [facility]  . INSERTION OF MESH N/A 11/02/2012   Performed by Madilyn Hook, DO at [facility]  . LAPAROSCOPY DIAGNOSTIC  bilateral inguinal hernia N/A 11/02/2012   Performed by Madilyn Hook, DO at [facility] ORS  . right fracture      right leg compound  fracture-age 5  . SHOULDER ARTHROSCOPY     lt  . TONSILLECTOMY     Family History Family History  Problem Relation Age of Onset  . Kidney Stones Mother   . Hyperlipidemia Mother   . Diabetes Mother   . Alzheimer's disease Mother   . Kidney Stones Sister     Social History Social History   Tobacco Use  . Smoking status: Current Every Day Smoker    Types: Cigarettes    Last attempt to quit: 03/09/1989    Years since quitting: 28.0  . Smokeless tobacco: Never Used  Substance Use Topics  . Alcohol use: Yes    Alcohol/week: 1.8 oz    Types: 3 Cans of beer per week    Comment: beer  . Drug use: No   Allergies Patient has no known allergies.  Review of Systems Review of Systems All other systems are reviewed and are negative for acute change except as noted in the HPI  Physical Exam Vital Signs  I have reviewed the triage vital signs BP 127/76 (BP Location: Right Arm)   Pulse 98   Temp 98.9 F (37.2 C) (Oral)   Resp 18   Ht 6' (1.829 m)   Wt 95.3 kg (210 lb)   SpO2 99%   BMI 28.48 kg/m   Physical Exam  Constitutional: He is oriented to person, place, and time. He appears well-developed and well-nourished. No distress.  HENT:  Head: Normocephalic and atraumatic.  Right Ear: External ear normal.  Left Ear: External ear normal.  Nose: Nose normal.  Mouth/Throat: Mucous membranes are normal. No trismus in the jaw.  Eyes: Conjunctivae and EOM are normal. No scleral icterus.  Neck: Normal range of motion and phonation normal.  Cardiovascular: Normal rate and regular rhythm.  Pulmonary/Chest: Effort normal. No stridor. No respiratory distress.  Abdominal: He exhibits no distension.  Musculoskeletal: Normal range of motion. He exhibits no edema.       Left upper leg: He exhibits swelling.       Legs: Neurological: He is alert and oriented to person, place, and time.  Skin: He is not diaphoretic.  Psychiatric: He has a normal mood and affect. His behavior is  normal.  Vitals reviewed.   ED Results and Treatments Labs (all labs ordered are listed, but only abnormal results are displayed) Labs Reviewed - No data to display  EKG  EKG Interpretation  Date/Time:    Ventricular Rate:    PR Interval:    QRS Duration:   QT Interval:    QTC Calculation:   R Axis:     Text Interpretation:        Radiology Dg Femur Min 2 Views Left  Result Date: 03/17/2017 CLINICAL DATA:  Patient was cutting a tree in a branch fell hitting the patient in the left thigh. Abrasions along the anterior mid thigh and anterior knee. EXAM: LEFT FEMUR 2 VIEWS COMPARISON:  None. FINDINGS: There is no evidence of fracture or other focal bone lesions. No joint dislocations. Slight degenerative joint space narrowing of the left hip and knee. Minimal spurring about the patella. Soft tissues are unremarkable. IMPRESSION: No acute osseous abnormality of the left femur. Electronically Signed   By: Ashley Royalty M.D.   On: 03/17/2017 16:41   Pertinent labs & imaging results that were available during my care of the patient were reviewed by me and considered in my medical decision making (see chart for details).  Medications Ordered in ED Medications - No data to display                                                                                                                                  Procedures Procedures  (including critical care time)  Medical Decision Making / ED Course I have reviewed the nursing notes for this encounter and the patient's prior records (if available in EHR or on provided paperwork).    Plain film without evidence of fracture or dislocation.  Presentation is consistent with soft tissue injury likely hematoma.  Discussed supportive and symptomatic management.  Patient was given strict return precautions regarding localized  compartment syndrome from the hematoma.  Patient already with crutches at home.  The patient appears reasonably screened and/or stabilized for discharge and I doubt any other medical condition or other [facility] requiring further screening, evaluation, or treatment in the ED at this time prior to discharge.  The patient is safe for discharge with strict return precautions.   Final Clinical Impression(s) / ED Diagnoses Final diagnoses:  Leg hematoma, left, initial encounter  Abrasion of left lower extremity, initial encounter    Disposition: Discharge  Condition: Good  I have discussed the results, Dx and Tx plan with the patient who expressed understanding and agree(s) with the plan. Discharge instructions discussed at great length. The patient was given strict return precautions who verbalized understanding of the instructions. No further questions at time of discharge.    ED Discharge Orders    None       Follow Up: Jani Gravel, MD [place]  [address] [phone]  Schedule an appointment as soon as possible for a visit  As needed    This chart was dictated using voice recognition software.  Despite best efforts to proofread,  errors can occur which can  change the documentation meaning.   Fatima Blank, MD 03/17/17 [phone]

## 2017-05-15 ENCOUNTER — Ambulatory Visit: Payer: Medicare Other | Attending: Physician Assistant | Admitting: Physical Therapy

## 2017-05-15 ENCOUNTER — Encounter: Payer: Self-pay | Admitting: Physical Therapy

## 2017-05-15 DIAGNOSIS — R293 Abnormal posture: Secondary | ICD-10-CM | POA: Diagnosis present

## 2017-05-15 DIAGNOSIS — G8929 Other chronic pain: Secondary | ICD-10-CM | POA: Insufficient documentation

## 2017-05-15 DIAGNOSIS — M545 Low back pain, unspecified: Secondary | ICD-10-CM

## 2017-05-15 NOTE — Therapy (Signed)
[facility] [place], [place], [address] Phone: [phone]   Fax:  [phone]  Physical Therapy Evaluation  Patient Details  Name: Kirk Brown MRN: [id number] Date of Birth: [birth year] Referring Provider: Legrand Como Roche PA-C   Encounter Date: 05/15/2017  PT End of Session - 05/15/17 1641    Visit Number  1    Number of Visits  12    Date for PT Re-Evaluation  06/26/17    PT Start Time  0230    PT Stop Time  0313    PT Time Calculation (min)  43 min    Activity Tolerance  Patient tolerated treatment well    Behavior During Therapy  [facility] for tasks assessed/performed       Past Medical History:  Diagnosis Date  . Arthritis   . Chronic back pain   . Diabetes mellitus without complication ([place])   . Gout   . Hyperlipemia   . Hypertension   . RLS (restless legs syndrome)   . Wears glasses     Past Surgical History:  Procedure Laterality Date  . ANKLE ARTHROSCOPY WITH ARTHRODESIS  03/12/2012   Procedure: ANKLE ARTHROSCOPY WITH ARTHRODESIS;  Surgeon: Wylene Simmer, MD;  Location: [place];  Service: Orthopedics;  Laterality: Right;  Right Ankle Arthroscopy with Arthrodesis and  Bone Marrow Aspiration Right Hip (BMAC)  . CARPAL TUNNEL RELEASE     both hands  . COLONOSCOPY    . ELBOW ARTHROSCOPY     both  . INSERTION OF MESH N/A 11/02/2012   Procedure: INSERTION OF MESH;  Surgeon: Madilyn Hook, DO;  Location: [facility];  Service: General;  Laterality: N/A;  . LAPAROSCOPY N/A 11/02/2012   Procedure: LAPAROSCOPY DIAGNOSTIC  bilateral inguinal hernia ;  Surgeon: Madilyn Hook, DO;  Location: [facility];  Service: General;  Laterality: N/A;  . right fracture      right leg compound fracture-age 33  . SHOULDER ARTHROSCOPY     lt  . TONSILLECTOMY    . UMBILICAL HERNIA REPAIR N/A 11/02/2012   Procedure: HERNIA REPAIR UMBILICAL ADULT;  Surgeon: Madilyn Hook, DO;  Location: [facility];  Service: General;  Laterality: N/A;     There were no vitals filed for this visit.   Subjective Assessment - 05/15/17 1711    Subjective  The patient reports a long h/o low and midback pain.  He reports that his pain tends to start in his left low back regiona nd goes up his spine to his midback region and then across.  He reports a resting pain-level of 4/10 today but without medication his pain can easily rise to 7+/10.      Pertinent History  DM.    Limitations  Walking    How long can you walk comfortably?  A community distance with pain.    Diagnostic tests  MRI: Spondylosis most notable at L4-5 where there is facet arthropathy and disc a bulge resulting in moderate bilateral foraminal narrowing and mild central canal stenosis.    Patient Stated Goals  Reduce pain and do more things without as much pain.    Currently in Pain?  Yes    Pain Score  4     Pain Location  Back    Pain Orientation  Left;Mid    Pain Descriptors / Indicators  Aching;Sharp    Pain Type  Chronic pain    Pain Onset  More than a month ago    Pain Frequency  Constant  Aggravating Factors   See above.    Pain Relieving Factors  See above.         [facility] PT Assessment - 05/15/17 0001      Assessment   Medical Diagnosis  Spondylosis without myleopathy or radiculopathy.    Referring Provider  Legrand Como Roche PA-C    Onset Date/Surgical Date  -- Ongoing.      Precautions   Precautions  None      Restrictions   Weight Bearing Restrictions  No      Balance Screen   Has the patient fallen in the past 6 months  No    Has the patient had a decrease in activity level because of a fear of falling?   No    Is the patient reluctant to leave their home because of a fear of falling?   No      Home Film/video editor residence      Prior Function   Level of Independence  Independent      Posture/Postural Control   Posture/Postural Control  Postural limitations    Postural Limitations  Decreased lumbar lordosis      ROM  / Strength   AROM / PROM / Strength  AROM;Strength      AROM   Overall AROM Comments  Lumbar intervertebral movement actively into flexion limited by 40% anfd active lumbar extension to 17 degrees.      Strength   Overall Strength Comments  Right ankle fused.  Left ankle of normal strength as well as bilateral knee and hip strength.      Palpation   Palpation comment  Tender to palpation with overpressure at L5-S1.  He is also tender over the left iliolumbar ligament and very tender at T7.        Special Tests    Special Tests  -- Diminished bilateral LE DTR's.    Other special tests  (=+ leg lengths; (-) SLR and FABER testing.      Bed Mobility   Bed Mobility  -- Independent.      Ambulation/Gait   Gait Comments  WNL.             Objective measurements completed on examination: See above findings.      [facility] Adult PT Treatment/Exercise - 05/15/17 0001      Modalities   Modalities  Moist Heat      Moist Heat Therapy   Number Minutes Moist Heat  15 Minutes    Moist Heat Location  Lumbar Spine                  PT Long Term Goals - 05/15/17 1743      PT LONG TERM GOAL #1   Title  Independent with a HEP.    Time  6    Period  Weeks    Status  New      PT LONG TERM GOAL #2   Title  Stand 30 minutes with pain not > 3-4/10.    Time  6    Period  Weeks    Status  New      PT LONG TERM GOAL #3   Title  Perform ADL's with pain not > 3-4/10.    Time  6    Period  Weeks    Status  New             Plan - 05/15/17 1728    Clinical Impression Statement  The patient presents to OPPT with chronic back pain.  His CC is left low back/SIJ and mid-thoracic pain.  He has some lumbar hypomobility and his pain limites his functional mobility over prolonged periods of time.  Patient will benefit from core exercises and a trial of traction treatments.    History and Personal Factors relevant to plan of care:  Chronicity of condition.    Clinical  Presentation  Stable    Clinical Decision Making  Low    Rehab Potential  Good    PT Frequency  2x / week    PT Duration  6 weeks    PT Treatment/Interventions  ADLs/Self Care Home Management;Moist Heat;Ultrasound;Therapeutic exercise;Therapeutic activities;Patient/family education;Manual techniques;Dry needling;Traction    PT Next Visit Plan  Combo e'stim/U/S to left SIJ region f/b STW/M to this region; core exercise progression.  May try traction beginning at 35% body weight.    Consulted and Agree with Plan of Care  Patient       Patient will benefit from skilled therapeutic intervention in order to improve the following deficits and impairments:  Pain, Postural dysfunction, Decreased activity tolerance, Decreased range of motion  Visit Diagnosis: Chronic left-sided low back pain without sciatica - Plan: PT plan of care cert/re-cert  Abnormal posture - Plan: PT plan of care cert/re-cert     Problem List Patient Active Problem List   Diagnosis Date Noted  . Other malaise and fatigue 11/20/2012  . Obesity, unspecified 10/08/2012  . Metabolic syndrome 76/81/1572  . DM (diabetes mellitus) ([place]) 10/08/2012  . Hyperglycemia 10/08/2012  . Deep inguinal pain 10/08/2012  . Gout 09/30/2012  . HTN (hypertension) 09/30/2012  . Other and unspecified hyperlipidemia 09/30/2012    Drue Camera, [place] MPT 05/15/2017, 5:46 PM  [facility] [address] [place], [place], [address] Phone: [phone]   Fax:  [phone]  Name: Kirk Brown MRN: [id number] Date of Birth: January 08, 1951

## 2017-05-19 ENCOUNTER — Ambulatory Visit: Payer: Medicare Other | Admitting: Physical Therapy

## 2017-05-19 ENCOUNTER — Encounter: Payer: Self-pay | Admitting: Physical Therapy

## 2017-05-19 DIAGNOSIS — M545 Low back pain, unspecified: Secondary | ICD-10-CM

## 2017-05-19 DIAGNOSIS — R293 Abnormal posture: Secondary | ICD-10-CM

## 2017-05-19 DIAGNOSIS — G8929 Other chronic pain: Secondary | ICD-10-CM

## 2017-05-19 NOTE — Therapy (Signed)
[facility] [place], [place], [address] Phone: [phone]   Fax:  [phone]  Physical Therapy Treatment  Patient Details  Name: Kirk Brown MRN: [id number] Date of Birth: Jul 23, 1950 Referring Provider: Legrand Como Roche PA-C   Encounter Date: 05/19/2017  PT End of Session - 05/19/17 1444    Visit Number  2    Number of Visits  12    Date for PT Re-Evaluation  06/26/17    PT Start Time  0145    PT Stop Time  0240    PT Time Calculation (min)  55 min       Past Medical History:  Diagnosis Date  . Arthritis   . Chronic back pain   . Diabetes mellitus without complication ([place])   . Gout   . Hyperlipemia   . Hypertension   . RLS (restless legs syndrome)   . Wears glasses     Past Surgical History:  Procedure Laterality Date  . ANKLE ARTHROSCOPY WITH ARTHRODESIS  03/12/2012   Procedure: ANKLE ARTHROSCOPY WITH ARTHRODESIS;  Surgeon: Wylene Simmer, MD;  Location: [facility];  Service: Orthopedics;  Laterality: Right;  Right Ankle Arthroscopy with Arthrodesis and  Bone Marrow Aspiration Right Hip (BMAC)  . CARPAL TUNNEL RELEASE     both hands  . COLONOSCOPY    . ELBOW ARTHROSCOPY     both  . INSERTION OF MESH N/A 11/02/2012   Procedure: INSERTION OF MESH;  Surgeon: Madilyn Hook, DO;  Location: [facility];  Service: General;  Laterality: N/A;  . LAPAROSCOPY N/A 11/02/2012   Procedure: LAPAROSCOPY DIAGNOSTIC  bilateral inguinal hernia ;  Surgeon: Madilyn Hook, DO;  Location: [facility];  Service: General;  Laterality: N/A;  . right fracture      right leg compound fracture-age 49  . SHOULDER ARTHROSCOPY     lt  . TONSILLECTOMY    . UMBILICAL HERNIA REPAIR N/A 11/02/2012   Procedure: HERNIA REPAIR UMBILICAL ADULT;  Surgeon: Madilyn Hook, DO;  Location: [facility];  Service: General;  Laterality: N/A;    There were no vitals filed for this visit.  Subjective Assessment - 05/19/17 1401    Subjective  No new  complaints.    Diagnostic tests  MRI: Spondylosis most notable at L4-5 where there is facet arthropathy and disc a bulge resulting in moderate bilateral foraminal narrowing and mild central canal stenosis.    Pain Score  3     Pain Location  Back    Pain Orientation  Mid    Pain Descriptors / Indicators  Aching;Sharp    Pain Onset  More than a month ago                      [facility] Adult PT Treatment/Exercise - 05/19/17 0001      Exercises   Exercises  Shoulder;Lumbar      Lumbar Exercises: Standing   Other Standing Lumbar Exercises  XTS with straddle stance scap retraction 2 x 20.    Other Standing Lumbar Exercises  Pink XTS forward punches with draw-in activation 2 x 20 reps.      Lumbar Exercises: Seated   Other Seated Lumbar Exercises  Lat PD with 40# to fatigue x 2.    Other Seated Lumbar Exercises  TuffStuff machine:  30# to fatigue x 2.      Knee/Hip Exercises: Aerobic   Nustep  Level 4 x 20 minutes with steps/min at 80-90/minute.  Knee/Hip Exercises: Supine   Other Supine Knee/Hip Exercises  Hip bridges, excellent and slow reps 2 x 20 reps.             PT Education - 05/19/17 1427    Education provided  Yes    Education Details  Patient instructed in draw-in core activation exercise.    Person(s) Educated  Patient    Methods  Explanation;Demonstration    Comprehension  Verbalized understanding;Returned demonstration          PT Long Term Goals - 05/15/17 1743      PT LONG TERM GOAL #1   Title  Independent with a HEP.    Time  6    Period  Weeks    Status  New      PT LONG TERM GOAL #2   Title  Stand 30 minutes with pain not > 3-4/10.    Time  6    Period  Weeks    Status  New      PT LONG TERM GOAL #3   Title  Perform ADL's with pain not > 3-4/10.    Time  6    Period  Weeks    Status  New              Patient will benefit from skilled therapeutic intervention in order to improve the following deficits and  impairments:     Visit Diagnosis: Chronic left-sided low back pain without sciatica  Abnormal posture     Problem List Patient Active Problem List   Diagnosis Date Noted  . Other malaise and fatigue 11/20/2012  . Obesity, unspecified 10/08/2012  . Metabolic syndrome 40/97/3532  . DM (diabetes mellitus) ([place]) 10/08/2012  . Hyperglycemia 10/08/2012  . Deep inguinal pain 10/08/2012  . Gout 09/30/2012  . HTN (hypertension) 09/30/2012  . Other and unspecified hyperlipidemia 09/30/2012    Corinthian Kemler, [place] MPT 05/19/2017, 3:56 PM  [facility] [address] [place], [place], [address] Phone: [phone]   Fax:  [phone]  Name: Kirk Brown MRN: [id number] Date of Birth: 01-07-1951

## 2017-05-22 ENCOUNTER — Ambulatory Visit: Payer: Medicare Other | Admitting: Physical Therapy

## 2017-05-22 ENCOUNTER — Encounter: Payer: Self-pay | Admitting: Physical Therapy

## 2017-05-22 DIAGNOSIS — M545 Low back pain: Secondary | ICD-10-CM | POA: Diagnosis not present

## 2017-05-22 DIAGNOSIS — G8929 Other chronic pain: Secondary | ICD-10-CM

## 2017-05-22 DIAGNOSIS — R293 Abnormal posture: Secondary | ICD-10-CM

## 2017-05-22 NOTE — Therapy (Signed)
[facility] [place], [place], [address] Phone: [phone]   Fax:  [phone]  Physical Therapy Treatment  Patient Details  Name: Kirk Brown MRN: [id number] Date of Birth: [birth year] Referring Provider: Legrand Como Roche PA-C   Encounter Date: 05/22/2017  PT End of Session - 05/22/17 1457    Visit Number  3    Number of Visits  12    Date for PT Re-Evaluation  06/26/17    PT Start Time  1418    PT Stop Time  1459    PT Time Calculation (min)  41 min    Activity Tolerance  Patient tolerated treatment well    Behavior During Therapy  [facility] for tasks assessed/performed       Past Medical History:  Diagnosis Date  . Arthritis   . Chronic back pain   . Diabetes mellitus without complication ([place])   . Gout   . Hyperlipemia   . Hypertension   . RLS (restless legs syndrome)   . Wears glasses     Past Surgical History:  Procedure Laterality Date  . ANKLE ARTHROSCOPY WITH ARTHRODESIS  03/12/2012   Procedure: ANKLE ARTHROSCOPY WITH ARTHRODESIS;  Surgeon: Wylene Simmer, MD;  Location: [facility];  Service: Orthopedics;  Laterality: Right;  Right Ankle Arthroscopy with Arthrodesis and  Bone Marrow Aspiration Right Hip (BMAC)  . CARPAL TUNNEL RELEASE     both hands  . COLONOSCOPY    . ELBOW ARTHROSCOPY     both  . INSERTION OF MESH N/A 11/02/2012   Procedure: INSERTION OF MESH;  Surgeon: Madilyn Hook, DO;  Location: [facility];  Service: General;  Laterality: N/A;  . LAPAROSCOPY N/A 11/02/2012   Procedure: LAPAROSCOPY DIAGNOSTIC  bilateral inguinal hernia ;  Surgeon: Madilyn Hook, DO;  Location: [facility];  Service: General;  Laterality: N/A;  . right fracture      right leg compound fracture-age 39  . SHOULDER ARTHROSCOPY     lt  . TONSILLECTOMY    . UMBILICAL HERNIA REPAIR N/A 11/02/2012   Procedure: HERNIA REPAIR UMBILICAL ADULT;  Surgeon: Madilyn Hook, DO;  Location: [facility];  Service: General;  Laterality: N/A;     There were no vitals filed for this visit.  Subjective Assessment - 05/22/17 1423    Subjective  Patient arrived with some soreness from last treatment from exercises yet no increased pain    Pertinent History  DM.    Limitations  Walking    How long can you walk comfortably?  A community distance with pain.    Diagnostic tests  MRI: Spondylosis most notable at L4-5 where there is facet arthropathy and disc a bulge resulting in moderate bilateral foraminal narrowing and mild central canal stenosis.    Patient Stated Goals  Reduce pain and do more things without as much pain.    Currently in Pain?  Yes    Pain Score  3     Pain Location  Back    Pain Orientation  Mid;Lower    Pain Descriptors / Indicators  Aching;Sharp    Pain Type  Chronic pain    Pain Onset  More than a month ago    Pain Frequency  Constant    Aggravating Factors   any prolong position esp sitting     Pain Relieving Factors  meds and rest                      [facility] Adult  PT Treatment/Exercise - 05/22/17 0001      Lumbar Exercises: Standing   Other Standing Lumbar Exercises  green t-band for scap retractions and lat pull x20 each      Lumbar Exercises: Supine   Ab Set  20 reps;5 seconds    Glut Set  20 reps;5 seconds    Bent Knee Raise  3 seconds 2x10    Bridge  20 reps;3 seconds  then with grren t-band x10-20 hip abd resitance    Straight Leg Raise  3 seconds 2x10      Knee/Hip Exercises: Aerobic   Nustep  x12 min UE/LE posture/core focus              PT Education - 05/22/17 1453    Education provided  Yes    Education Details  HEP core activation progress    Person(s) Educated  Patient    Methods  Explanation;Demonstration;Handout    Comprehension  Verbalized understanding;Returned demonstration          PT Long Term Goals - 05/22/17 1458      PT LONG TERM GOAL #1   Title  Independent with a HEP.    Time  6    Period  Weeks    Status  Achieved 05/22/16      PT LONG  TERM GOAL #2   Title  Stand 30 minutes with pain not > 3-4/10.    Time  6    Period  Weeks    Status  On-going      PT LONG TERM GOAL #3   Title  Perform ADL's with pain not > 3-4/10.    Time  6    Period  Weeks    Status  On-going            Plan - 05/22/17 1458    Clinical Impression Statement  Patient tolerated treatment well today. Patient able to progress core activation exercises today. Focused on supine to standing progression with HEP provided. Patient has a good understanding of activation and progression with propor technique. LTG #1 met and others ongoing due to pain deficts.     Rehab Potential  Good    PT Frequency  2x / week    PT Duration  6 weeks    PT Treatment/Interventions  ADLs/Self Care Home Management;Moist Heat;Ultrasound;Therapeutic exercise;Therapeutic activities;Patient/family education;Manual techniques;Dry needling;Traction    PT Next Visit Plan  cont with Combo e'stim/U/S to left SIJ region f/b STW/M to this region; core exercise progression.  May try traction beginning at 35% body weight.    Consulted and Agree with Plan of Care  Patient       Patient will benefit from skilled therapeutic intervention in order to improve the following deficits and impairments:  Pain, Postural dysfunction, Decreased activity tolerance, Decreased range of motion  Visit Diagnosis: Chronic left-sided low back pain without sciatica  Abnormal posture     Problem List Patient Active Problem List   Diagnosis Date Noted  . Other malaise and fatigue 11/20/2012  . Obesity, unspecified 10/08/2012  . Metabolic syndrome 17/40/8144  . DM (diabetes mellitus) ([place]) 10/08/2012  . Hyperglycemia 10/08/2012  . Deep inguinal pain 10/08/2012  . Gout 09/30/2012  . HTN (hypertension) 09/30/2012  . Other and unspecified hyperlipidemia 09/30/2012    Phillips Climes, PTA 05/22/2017, 3:01 PM  [facility] [place], [place], [address] Phone: [phone]   Fax:  [phone]  Name: Kirk Brown MRN: [id number] Date of  Birth: [birth year]

## 2017-05-22 NOTE — Patient Instructions (Signed)
  Pelvic Tilt: Posterior - Legs Bent (Supine)  Tighten stomach and flatten back by rolling pelvis down. Hold _10___ seconds. Relax. Repeat _10-30___ times per set. Do __2__ sets per session. Do _2___ sessions per day.   Bent Leg Lift (Hook-Lying)  Tighten stomach and slowly raise right leg _5___ inches from floor. Keep trunk rigid. Hold _3___ seconds. Repeat _10___ times per set. Do ___2-3_ sets per session. Do __2__ sessions per day.   Straight Leg Raise  Tighten stomach and slowly raise locked right leg __4__ inches from floor. Repeat __10-30__ times per set. Do __2__ sets per session. Do __2__ sessions per day.  Bridging  Slowly raise buttocks from floor, keeping stomach tight. Repeat _10___ times per set. Do __2__ sets per session. Do __2__ sessions per day.     Scapular Retraction: Bilateral  Facing anchor, pull arms back, bringing shoulder blades together. Repeat _30___ times per set. Do __2-3__ sets per session. Do _2___ sessions per day.                 ELASTIC BAND LAT PULLS  Hold an elastic band with both arms in front of you and with your elbows straight. Your arms should be elevated. Next, pull the band downwards and back towards your side as you bend your elbows. x20 3 sec hold      Scapular Retraction Resistance Band Exercise   With arms straight out in front of you, slowly pull both arms back on resistance band towards side of body at chest level- resembling a rowing motion. Remember to maintain slow and controlled movements, keeping elbows in and close to  body when at chest level then slowly relax to starting position. x20 3 sec hold    Bridge Clams  Put band around both legs above the knee. Tighten core and squeeze glutes. Lift hips toward ceiling keeping muscles engaged. At the top, gently pull knees open against band, keeping hips high. Bring knees back to neutral, then lower hips. x10-20

## 2017-05-23 ENCOUNTER — Encounter: Payer: Medicare Other | Admitting: Physical Therapy

## 2017-05-26 ENCOUNTER — Encounter: Payer: Self-pay | Admitting: Physical Therapy

## 2017-05-26 ENCOUNTER — Ambulatory Visit: Payer: Medicare Other | Admitting: Physical Therapy

## 2017-05-26 DIAGNOSIS — M545 Low back pain, unspecified: Secondary | ICD-10-CM

## 2017-05-26 DIAGNOSIS — G8929 Other chronic pain: Secondary | ICD-10-CM

## 2017-05-26 DIAGNOSIS — R293 Abnormal posture: Secondary | ICD-10-CM

## 2017-05-26 NOTE — Therapy (Signed)
[facility] [place], [place], [address] Phone: [phone]   Fax:  [phone]  Physical Therapy Treatment  Patient Details  Name: Kirk Brown MRN: [id number] Date of Birth: Sep 17, 1950 Referring Provider: Vanice Sarah, PA-C   Encounter Date: 05/26/2017    Past Medical History:  Diagnosis Date  . Arthritis   . Chronic back pain   . Diabetes mellitus without complication ([place])   . Gout   . Hyperlipemia   . Hypertension   . RLS (restless legs syndrome)   . Wears glasses     Past Surgical History:  Procedure Laterality Date  . ANKLE ARTHROSCOPY WITH ARTHRODESIS  03/12/2012   Procedure: ANKLE ARTHROSCOPY WITH ARTHRODESIS;  Surgeon: Wylene Simmer, MD;  Location: [facility];  Service: Orthopedics;  Laterality: Right;  Right Ankle Arthroscopy with Arthrodesis and  Bone Marrow Aspiration Right Hip (BMAC)  . CARPAL TUNNEL RELEASE     both hands  . COLONOSCOPY    . ELBOW ARTHROSCOPY     both  . INSERTION OF MESH N/A 11/02/2012   Procedure: INSERTION OF MESH;  Surgeon: Madilyn Hook, DO;  [facility];  Service: General;  Laterality: N/A;  . LAPAROSCOPY N/A 11/02/2012   Procedure: LAPAROSCOPY DIAGNOSTIC  bilateral inguinal hernia ;  Surgeon: Madilyn Hook, DO;  Location: [facility];  Service: General;  Laterality: N/A;  . right fracture      right leg compound fracture-age 41  . SHOULDER ARTHROSCOPY     lt  . TONSILLECTOMY    . UMBILICAL HERNIA REPAIR N/A 11/02/2012   Procedure: HERNIA REPAIR UMBILICAL ADULT;  Surgeon: Madilyn Hook, DO;  [facility];  Service: General;  Laterality: N/A;    There were no vitals filed for this visit.  Subjective Assessment - 05/26/17 1221    Subjective  Pt arriving to therapy today reporting 3/10 pain in his low back. Pt reporting he has been doing his HEP every other day.     Pertinent History  DM.    Limitations  Walking    How long can you walk comfortably?  A community  distance with pain.    Diagnostic tests  MRI: Spondylosis most notable at L4-5 where there is facet arthropathy and disc a bulge resulting in moderate bilateral foraminal narrowing and mild central canal stenosis.    Patient Stated Goals  Reduce pain and do more things without as much pain.    Currently in Pain?  Yes    Pain Score  3     Pain Location  Back    Pain Orientation  Lower    Pain Descriptors / Indicators  Aching;Sore    Pain Type  Chronic pain    Pain Onset  More than a month ago    Aggravating Factors   sitting or standing or lying down for long periods    Pain Relieving Factors  meds         [facility] PT Assessment - 05/26/17 0001      Assessment   Medical Diagnosis  Spondylosis without myleopathy or radiculopathy.    Referring Provider  Vanice Sarah, PA-C      Precautions   Precautions  None      Restrictions   Weight Bearing Restrictions  No                  [facility] Adult PT Treatment/Exercise - 05/26/17 0001      Lumbar Exercises: Standing   Other Standing Lumbar Exercises  green t-band for scap retractions and lat pull x20 each      Lumbar Exercises: Supine   Ab Set  20 reps;5 seconds    Glut Set  20 reps;5 seconds    Bent Knee Raise  3 seconds 2x10    Bridge  20 reps;3 seconds  then with grren t-band x10-20 hip abd resitance    Straight Leg Raise  3 seconds 2x10      Lumbar Exercises: Sidelying   Other Sidelying Lumbar Exercises  trunk stabalization and elongation, Pt attemtped but reported he couldn't do any further reps due to shoulder pain when resting with shoulder propped up      Lumbar Exercises: Quadruped   [place]  10 reps holding 5 seconds each      Knee/Hip Exercises: Aerobic   Nustep  x10 min UE/LE posture/core focus L6             PT Education - 05/26/17 1228    Education provided  Yes    Education Details  Verbal cues on abdominal cruntch    Person(s) Educated  Patient    Methods  Explanation;Demonstration     Comprehension  Verbalized understanding;Returned demonstration          PT Long Term Goals - 05/26/17 1225      PT LONG TERM GOAL #1   Title  Independent with a HEP.    Status  Achieved      PT LONG TERM GOAL #2   Title  Stand 30 minutes with pain not > 3-4/10.    Status  On-going      PT LONG TERM GOAL #3   Title  Perform ADL's with pain not > 3-4/10.    Status  On-going            Plan - 05/26/17 1243    Clinical Impression Statement  Pt tolerating treatment well today although limited with sidelying with shoulder propped up due to shoulder pain on the right side. Pt focusing on core strength and stability. Pt requiring verbal cues to keep abdominals activated during each exercise. Continue with skilled PT to progress toward pt's LTG's.     Rehab Potential  Good    PT Frequency  2x / week    PT Duration  6 weeks    PT Treatment/Interventions  ADLs/Self Care Home Management;Moist Heat;Ultrasound;Therapeutic exercise;Therapeutic activities;Patient/family education;Manual techniques;Dry needling;Traction    PT Next Visit Plan  Core exercise progression.  May try traction beginning at 35% body weight.    Consulted and Agree with Plan of Care  Patient       Patient will benefit from skilled therapeutic intervention in order to improve the following deficits and impairments:  Pain, Postural dysfunction, Decreased activity tolerance, Decreased range of motion  Visit Diagnosis: Chronic left-sided low back pain without sciatica  Abnormal posture     Problem List Patient Active Problem List   Diagnosis Date Noted  . Other malaise and fatigue 11/20/2012  . Obesity, unspecified 10/08/2012  . Metabolic syndrome 93/26/7124  . DM (diabetes mellitus) ([place]) 10/08/2012  . Hyperglycemia 10/08/2012  . Deep inguinal pain 10/08/2012  . Gout 09/30/2012  . HTN (hypertension) 09/30/2012  . Other and unspecified hyperlipidemia 09/30/2012    Oretha Caprice, PT 05/26/2017,  12:46 PM  [facility] [place], [place], [address] Phone: [phone]   Fax:  [phone]  Name: Kc Summerson MRN: [id number] Date of Birth: 1951/03/23

## 2017-05-30 ENCOUNTER — Encounter: Payer: Medicare Other | Admitting: Physical Therapy

## 2017-08-08 ENCOUNTER — Emergency Department (HOSPITAL_COMMUNITY): Payer: Medicare Other

## 2017-08-08 ENCOUNTER — Emergency Department (HOSPITAL_COMMUNITY)
Admission: EM | Admit: 2017-08-08 | Discharge: 2017-08-09 | Disposition: A | Discharge status: Home / Self Care | Payer: Medicare Other | Attending: Emergency Medicine | Admitting: Emergency Medicine

## 2017-08-08 ENCOUNTER — Encounter (HOSPITAL_COMMUNITY): Payer: Self-pay

## 2017-08-08 DIAGNOSIS — F17210 Nicotine dependence, cigarettes, uncomplicated: Secondary | ICD-10-CM | POA: Insufficient documentation

## 2017-08-08 DIAGNOSIS — F411 Generalized anxiety disorder: Secondary | ICD-10-CM | POA: Diagnosis not present

## 2017-08-08 DIAGNOSIS — Z7984 Long term (current) use of oral hypoglycemic drugs: Secondary | ICD-10-CM | POA: Diagnosis not present

## 2017-08-08 DIAGNOSIS — Z79899 Other long term (current) drug therapy: Secondary | ICD-10-CM | POA: Insufficient documentation

## 2017-08-08 DIAGNOSIS — I10 Essential (primary) hypertension: Secondary | ICD-10-CM | POA: Diagnosis not present

## 2017-08-08 DIAGNOSIS — E119 Type 2 diabetes mellitus without complications: Secondary | ICD-10-CM | POA: Diagnosis not present

## 2017-08-08 LAB — CBC
HCT: 38.3 % — ABNORMAL LOW (ref 39.0–52.0)
HEMOGLOBIN: 13.5 g/dL (ref 13.0–17.0)
MCH: 30.3 pg (ref 26.0–34.0)
MCHC: 35.2 g/dL (ref 30.0–36.0)
MCV: 86.1 fL (ref 78.0–100.0)
Platelets: 203 10*3/uL (ref 150–400)
RBC: 4.45 MIL/uL (ref 4.22–5.81)
RDW: 12.7 % (ref 11.5–15.5)
WBC: 10.9 10*3/uL — ABNORMAL HIGH (ref 4.0–10.5)

## 2017-08-08 LAB — BASIC METABOLIC PANEL
ANION GAP: 12 (ref 5–15)
BUN: 14 mg/dL (ref 6–20)
CO2: 22 mmol/L (ref 22–32)
Calcium: 9.8 mg/dL (ref 8.9–10.3)
Chloride: 106 mmol/L (ref 101–111)
Creatinine, Ser: 0.76 mg/dL (ref 0.61–1.24)
GFR calc Af Amer: >60 mL/min (ref >60)
Glucose, Bld: 111 mg/dL — ABNORMAL HIGH (ref 65–99)
POTASSIUM: 3.9 mmol/L (ref 3.5–5.1)
Sodium: 140 mmol/L (ref 135–145)

## 2017-08-08 LAB — I-STAT TROPONIN, ED: Troponin i, poc: 0 ng/mL (ref 0.00–0.08)

## 2017-08-08 NOTE — ED Notes (Signed)
Pt complains of an anxiety attack with some sharp chest pains. Pt states his PCP just changed his meds Ativan to a new medication.

## 2017-08-08 NOTE — ED Triage Notes (Signed)
Pt states that for past two days he has been having a panic attack, feeling like the room is closing in, CP, SOB, N/V, anxiety meds not helping, has not has a BM for two days. Denies SI/HI/AVH

## 2017-08-09 MED ORDER — CHLORDIAZEPOXIDE HCL 25 MG PO CAPS
50.0000 mg | ORAL_CAPSULE | Freq: Once | ORAL | Status: AC
  Administered 2017-08-09: 50 mg via ORAL
  Filled 2017-08-09: qty 2

## 2017-08-09 MED ORDER — CHLORDIAZEPOXIDE HCL 25 MG PO CAPS: ORAL_CAPSULE | ORAL | 0 refills | Status: DC

## 2017-08-09 NOTE — ED Provider Notes (Signed)
[facility] EMERGENCY DEPARTMENT Provider Note   CSN: [id number] Arrival date & time: 08/08/17  1859     History   Chief Complaint Chief Complaint  Patient presents with  . Panic Attack    HPI Kirk Brown is a 67 y.o. male.  67 yo M with a chief complaint of increasing anxiety.  Per the patient he has been diagnosed with anxiety for many years.  He was on Ativan and then switched to a couple different medications.  Currently he is on a new medication but does not feel like it is helping.  He had a recent trip where he had to get out of the car because he was concerned that he was closed in.  He has had some episodes where he feels many thoughts running through his head and then suddenly he is overwhelmed and starts having trouble breathing and feels that his chest hurts.  This never happens on exertion no lower extremity edema no hemoptysis no history of PE or DVT no recent hospitalization or long trips.  He is looking for some help for this.  The history is provided by the patient.  Illness  This is a new problem. The current episode started more than 1 week ago. The problem occurs constantly. The problem has been gradually worsening. Associated symptoms include chest pain and shortness of breath. Pertinent negatives include no abdominal pain and no headaches. Nothing aggravates the symptoms. Nothing relieves the symptoms. He has tried nothing for the symptoms. The treatment provided no relief.    Past Medical History:  Diagnosis Date  . Arthritis   . Chronic back pain   . Diabetes mellitus without complication ([place])   . Gout   . Hyperlipemia   . Hypertension   . RLS (restless legs syndrome)   . Wears glasses     Patient Active Problem List   Diagnosis Date Noted  . Other malaise and fatigue 11/20/2012  . Obesity, unspecified 10/08/2012  . Metabolic syndrome 57/84/6962  . DM (diabetes mellitus) ([place]) 10/08/2012  . Hyperglycemia 10/08/2012  . Deep  inguinal pain 10/08/2012  . Gout 09/30/2012  . HTN (hypertension) 09/30/2012  . Other and unspecified hyperlipidemia 09/30/2012    Past Surgical History:  Procedure Laterality Date  . ANKLE ARTHROSCOPY WITH ARTHRODESIS  03/12/2012   Procedure: ANKLE ARTHROSCOPY WITH ARTHRODESIS;  Surgeon: Wylene Simmer, MD;  Location: [facility];  Service: Orthopedics;  Laterality: Right;  Right Ankle Arthroscopy with Arthrodesis and  Bone Marrow Aspiration Right Hip (BMAC)  . CARPAL TUNNEL RELEASE     both hands  . COLONOSCOPY    . ELBOW ARTHROSCOPY     both  . INSERTION OF MESH N/A 11/02/2012   Procedure: INSERTION OF MESH;  Surgeon: Madilyn Hook, DO;  [facility];  Service: General;  Laterality: N/A;  . LAPAROSCOPY N/A 11/02/2012   Procedure: LAPAROSCOPY DIAGNOSTIC  bilateral inguinal hernia ;  Surgeon: Madilyn Hook, DO;  Location[facility];  Service: General;  Laterality: N/A;  . right fracture      right leg compound fracture-age 41  . SHOULDER ARTHROSCOPY     lt  . TONSILLECTOMY    . UMBILICAL HERNIA REPAIR N/A 11/02/2012   Procedure: HERNIA REPAIR UMBILICAL ADULT;  Surgeon: Madilyn Hook, DO;  [facility];  Service: General;  Laterality: N/A;        Home Medications    Prior to Admission medications   Medication Sig Start Date End Date Taking? Authorizing  Provider  allopurinol (ZYLOPRIM) 300 MG tablet Take 1 tablet (300 mg total) by mouth every evening. Patient not taking: Reported on 10/11/2014 12/10/12   Vernie Shanks, MD  atorvastatin (LIPITOR) 40 MG tablet Take 1 tablet (40 mg total) by mouth every evening. 12/10/12   Vernie Shanks, MD  chlordiazePOXIDE (LIBRIUM) 25 MG capsule 50mg  PO TID x 1D, then 25-50mg  PO BID X 1D, then 25-50mg  PO QD X 1D 08/09/17   Deno Etienne, DO  clindamycin (CLEOCIN) 300 MG capsule Take 1 capsule (300 mg total) by mouth 4 (four) times daily. Patient not taking: Reported on 10/11/2014 03/28/14   Harden Mo, MD  fluticasone [facility]) 50  MCG/ACT nasal spray Place 2 sprays into both nostrils daily as needed for allergies or rhinitis.  08/31/14   [provider]  glimepiride (AMARYL) 4 MG tablet Take 4 mg by mouth at bedtime.    [provider]  HYDROcodone-acetaminophen (NORCO) 10-325 MG per tablet Take 1 tablet by mouth every 6 (six) hours as needed for moderate pain or severe pain.    [provider]  HYDROcodone-acetaminophen (NORCO) 5-325 MG per tablet Take 1 tablet by mouth every 4 (four) hours as needed for pain. Patient not taking: Reported on 10/11/2014 11/02/12   Madilyn Hook, DO  lisinopril (PRINIVIL,ZESTRIL) 20 MG tablet Take 1 tablet (20 mg total) by mouth every evening. 12/10/12   Vernie Shanks, MD  metFORMIN (GLUCOPHAGE) 500 MG tablet Take 500 mg by mouth daily with breakfast.    [provider]  Morphine Sulfate 40 MG CP24 Take by mouth.    [provider]  mupirocin ointment (BACTROBAN) 2 % Apply to the affected area twice daily for 7 days Patient not taking: Reported on 10/11/2014 03/25/14   Margarita Mail, PA-C  oxyCODONE-acetaminophen (PERCOCET) 5-325 MG per tablet 1 to 2 tablets every 6 hours as needed for pain. Patient not taking: Reported on 10/11/2014 03/28/14   Harden Mo, MD  pramipexole (MIRAPEX) 1.5 MG tablet TAKE 1 TABLET BY MOUTH AT BEDTIME 03/07/14   Chipper Herb, MD    Family History Family History  Problem Relation Age of Onset  . Kidney Stones Mother   . Hyperlipidemia Mother   . Diabetes Mother   . Alzheimer's disease Mother   . Kidney Stones Sister     Social History Social History   Tobacco Use  . Smoking status: Current Every Day Smoker    Types: Cigarettes    Last attempt to quit: 03/09/1989    Years since quitting: 28.4  . Smokeless tobacco: Never Used  Substance Use Topics  . Alcohol use: Yes    Alcohol/week: 1.8 oz    Types: 3 Cans of beer per week    Comment: beer  . Drug use: No     Allergies   Patient has no known  allergies.   Review of Systems Review of Systems  Constitutional: Negative for chills and fever.  HENT: Negative for congestion and facial swelling.   Eyes: Negative for discharge and visual disturbance.  Respiratory: Positive for shortness of breath.   Cardiovascular: Positive for chest pain. Negative for palpitations.  Gastrointestinal: Negative for abdominal pain, diarrhea and vomiting.  Musculoskeletal: Negative for arthralgias and myalgias.  Skin: Negative for color change and rash.  Neurological: Negative for tremors, syncope and headaches.  Psychiatric/Behavioral: Negative for confusion, dysphoric mood and suicidal ideas. The patient is nervous/anxious.      Physical Exam Updated Vital Signs BP [place] [facility])  156/81 (BP Location: Left Arm)   Pulse 62   Temp 98 F (36.7 C)   Resp 15   Ht 5\' 11"  (1.803 m)   Wt 97.5 kg (215 lb)   SpO2 100%   BMI 29.99 kg/m   Physical Exam  Constitutional: He is oriented to person, place, and time. He appears well-developed and well-nourished.  HENT:  Head: Normocephalic and atraumatic.  Eyes: Pupils are equal, round, and reactive to light. EOM are normal.  Neck: Normal range of motion. Neck supple. No JVD present.  Cardiovascular: Normal rate and regular rhythm. Exam reveals no gallop and no friction rub.  No murmur heard. Pulmonary/Chest: No respiratory distress. He has no wheezes.  Abdominal: He exhibits no distension. There is no rebound and no guarding.  Musculoskeletal: Normal range of motion.  Neurological: He is alert and oriented to person, place, and time.  Skin: No rash noted. No pallor.  Psychiatric: His behavior is normal. His mood appears anxious.  Nursing note and vitals reviewed.    ED Treatments / Results  Labs (all labs ordered are listed, but only abnormal results are displayed) Labs Reviewed  BASIC METABOLIC PANEL - Abnormal; Notable for the following components:      Result Value   Glucose, Bld 111 (*)    All other  components within normal limits  CBC - Abnormal; Notable for the following components:   WBC 10.9 (*)    HCT 38.3 (*)    All other components within normal limits  I-STAT TROPONIN, ED    EKG EKG Interpretation  Date/Time:  Wednesday July 30 2017 00:04:04 EDT Ventricular Rate:  67 PR Interval:  160 QRS Duration: 104 QT Interval:  412 QTC Calculation: 435 R Axis:   52 Text Interpretation:  Normal sinus rhythm Normal ECG No significant change since last tracing Confirmed by Deno Etienne [phone]) on 08/08/2017 11:48:30 PM   Radiology Dg Chest 2 View  Result Date: 08/08/2017 CLINICAL DATA:  Left-sided chest pain for a day and a half with chest tightness. EXAM: CHEST - 2 VIEW COMPARISON:  08/06/2016 FINDINGS: The heart size and mediastinal contours are within normal limits. Minimal aortic atherosclerosis at the arch. No aneurysmal dilatation of the aorta noted. Both lungs are clear. The visualized skeletal structures are unremarkable. IMPRESSION: No active cardiopulmonary disease. Stable minimal aortic atherosclerosis. Electronically Signed   By: Ashley Royalty M.D.   On: 08/08/2017 20:19    Procedures Procedures (including critical care time)  Medications Ordered in ED Medications  chlordiazePOXIDE (LIBRIUM) capsule 50 mg (has no administration in time range)     Initial Impression / Assessment and Plan / ED Course  I have reviewed the triage vital signs and the nursing notes.  Pertinent labs & imaging results that were available during my care of the patient were reviewed by me and considered in my medical decision making (see chart for details).     67 yo M with a chief complaint of worsening anxiety.  This is been an ongoing issue for him.  He is recently changed medications.  He feels it is not working.  Had a episode on a trip where he felt he was trapped in the car and could not get out.  No exertional symptoms.  I offered to obtain a d-dimer for him but is not willing to wait.   We will have him follow-up with his PCP.  Given a list of outpatient psych resources.  Discharge home.  12:21 AM:  I have  discussed the diagnosis/risks/treatment options with the patient and believe the pt to be eligible for discharge home to follow-up with PCP, psych. We also discussed returning to the ED immediately if new or worsening sx occur. We discussed the sx which are most concerning (e.g., sudden worsening pain, fever, inability to tolerate by mouth) that necessitate immediate return. Medications administered to the patient during their visit and any new prescriptions provided to the patient are listed below.  Medications given during this visit Medications  chlordiazePOXIDE (LIBRIUM) capsule 50 mg (has no administration in time range)     The patient appears reasonably screen and/or stabilized for discharge and I doubt any other medical condition or other [facility] requiring further screening, evaluation, or treatment in the ED at this time prior to discharge.    Final Clinical Impressions(s) / ED Diagnoses   Final diagnoses:  Generalized anxiety disorder    ED Discharge Orders        Ordered    chlordiazePOXIDE (LIBRIUM) 25 MG capsule     08/09/17 0015       Deno Etienne, DO 08/09/17 0024

## 2017-08-09 NOTE — Discharge Instructions (Signed)
Call your doctor on Monday.  Return for worsening symptoms.

## 2017-11-03 ENCOUNTER — Ambulatory Visit
Admission: RE | Admit: 2017-11-03 | Discharge: 2017-11-03 | Disposition: A | Discharge status: Home / Self Care | Payer: Medicare Other | Source: Ambulatory Visit | Attending: Physician Assistant | Admitting: Physician Assistant

## 2017-11-03 ENCOUNTER — Other Ambulatory Visit: Payer: Self-pay | Admitting: Physician Assistant

## 2017-11-03 DIAGNOSIS — M25511 Pain in right shoulder: Secondary | ICD-10-CM

## 2018-07-14 ENCOUNTER — Other Ambulatory Visit: Payer: Self-pay | Admitting: Pain Medicine

## 2018-07-14 DIAGNOSIS — M545 Low back pain, unspecified: Secondary | ICD-10-CM

## 2018-08-19 ENCOUNTER — Other Ambulatory Visit: Payer: Medicare Other

## 2018-12-06 ENCOUNTER — Other Ambulatory Visit: Payer: Self-pay

## 2018-12-06 ENCOUNTER — Ambulatory Visit
Admission: RE | Admit: 2018-12-06 | Discharge: 2018-12-06 | Disposition: A | Discharge status: Home / Self Care | Payer: Medicare Other | Source: Ambulatory Visit | Attending: Pain Medicine | Admitting: Pain Medicine

## 2018-12-06 DIAGNOSIS — M545 Low back pain, unspecified: Secondary | ICD-10-CM

## 2019-01-11 ENCOUNTER — Encounter: Payer: Self-pay | Admitting: Neurology

## 2019-01-11 ENCOUNTER — Other Ambulatory Visit: Payer: Self-pay

## 2019-01-11 ENCOUNTER — Ambulatory Visit (INDEPENDENT_AMBULATORY_CARE_PROVIDER_SITE_OTHER): Payer: Medicare Other | Admitting: Neurology

## 2019-01-11 VITALS — BP 156/85 | HR 51 | Temp 97.8°F | Ht 71.0 in | Wt 214.0 lb

## 2019-01-11 DIAGNOSIS — Z87891 Personal history of nicotine dependence: Secondary | ICD-10-CM

## 2019-01-11 DIAGNOSIS — R4189 Other symptoms and signs involving cognitive functions and awareness: Secondary | ICD-10-CM | POA: Diagnosis not present

## 2019-01-11 DIAGNOSIS — Z79899 Other long term (current) drug therapy: Secondary | ICD-10-CM | POA: Insufficient documentation

## 2019-01-11 DIAGNOSIS — Z79891 Long term (current) use of opiate analgesic: Secondary | ICD-10-CM | POA: Diagnosis not present

## 2019-01-11 DIAGNOSIS — E119 Type 2 diabetes mellitus without complications: Secondary | ICD-10-CM

## 2019-01-11 DIAGNOSIS — M10449 Other secondary gout, unspecified hand: Secondary | ICD-10-CM

## 2019-01-11 DIAGNOSIS — R490 Dysphonia: Secondary | ICD-10-CM | POA: Insufficient documentation

## 2019-01-11 NOTE — Patient Instructions (Signed)
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
## 2019-01-24 ENCOUNTER — Ambulatory Visit (INDEPENDENT_AMBULATORY_CARE_PROVIDER_SITE_OTHER): Payer: Medicare Other | Admitting: Neurology

## 2019-01-24 DIAGNOSIS — M10449 Other secondary gout, unspecified hand: Secondary | ICD-10-CM

## 2019-01-24 DIAGNOSIS — R490 Dysphonia: Secondary | ICD-10-CM

## 2019-01-24 DIAGNOSIS — E119 Type 2 diabetes mellitus without complications: Secondary | ICD-10-CM

## 2019-01-24 DIAGNOSIS — Z87891 Personal history of nicotine dependence: Secondary | ICD-10-CM

## 2019-01-24 DIAGNOSIS — Z79899 Other long term (current) drug therapy: Secondary | ICD-10-CM

## 2019-01-24 DIAGNOSIS — Z79891 Long term (current) use of opiate analgesic: Secondary | ICD-10-CM

## 2019-01-24 DIAGNOSIS — G4731 Primary central sleep apnea: Secondary | ICD-10-CM

## 2019-01-24 DIAGNOSIS — R4189 Other symptoms and signs involving cognitive functions and awareness: Secondary | ICD-10-CM
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
## 2019-02-08 NOTE — Addendum Note (Signed)
Addended by: Larey Seat on: 02/08/2019 08:59 AM   Modules accepted: Orders

## 2019-02-10 ENCOUNTER — Telehealth: Payer: Self-pay | Admitting: Neurology

## 2019-02-10 NOTE — Telephone Encounter (Signed)
-----   Message from Larey Seat, MD sent at 02/08/2019  8:59 AM EDT ----- IMPRESSION:   1. Central Sleep Apnea (CSA), moderate degree at AHI 22.5/h.  2. Mild Snoring   RECOMMENDATIONS:   1. Advise full-night, attended, CPAP titration study to optimize  therapy.  Central apnea can worsen under CPAP and may require BiPAP titration.

## 2019-02-10 NOTE — Telephone Encounter (Signed)

## 2019-02-10 NOTE — Telephone Encounter (Signed)
Called patient to discuss sleep study results. No answer at this time. LVM for the patient to call back.   

## 2019-02-18 ENCOUNTER — Telehealth: Payer: Self-pay

## 2019-02-18 NOTE — Telephone Encounter (Signed)
Pt is not interested in CPAP at this time. Pt has an appt with Dr. Maudie Mercury soon and want to discuss with him first. Pt was given my direct number to call me back when he is ready to schedule.

## 2020-01-01 IMAGING — MR MRI LUMBAR SPINE WITHOUT CONTRAST
4 of 5 series · 25 of 48 positions shown · non-contrast
Comparison: Lumbar MRI 02/24/2015. CT Abdomen and Pelvis
10/05/2012.

CLINICAL DATA: 68-year-old male with chronic low back pain
radiating to the bilateral buttocks legs and feet. Spinal injection
1 month ago without improvement. Numbness in both feet.

EXAM:
MRI LUMBAR SPINE WITHOUT CONTRAST
TECHNIQUE: Multiplanar, multisequence MR imaging of the lumbar spine was
performed. No intravenous contrast was administered.

[Series 3: T2 · sagittal · 4.0mm · 0.55mm/px · 6 of 15 slices shown (1 of 2)]
[im 1/15]
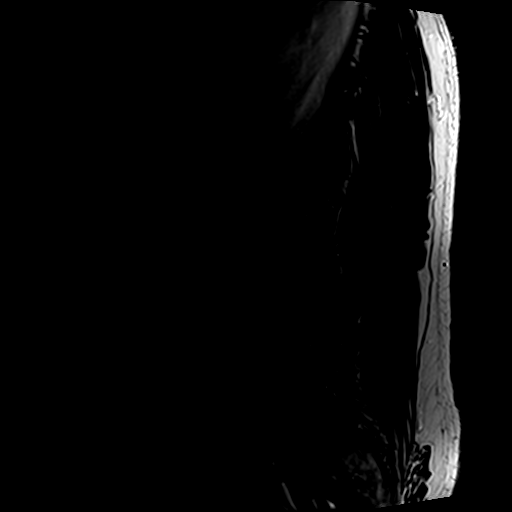
[im 3/15]
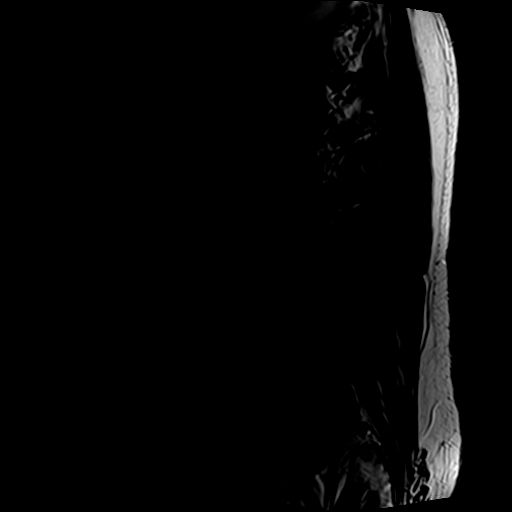
[im 6/15]
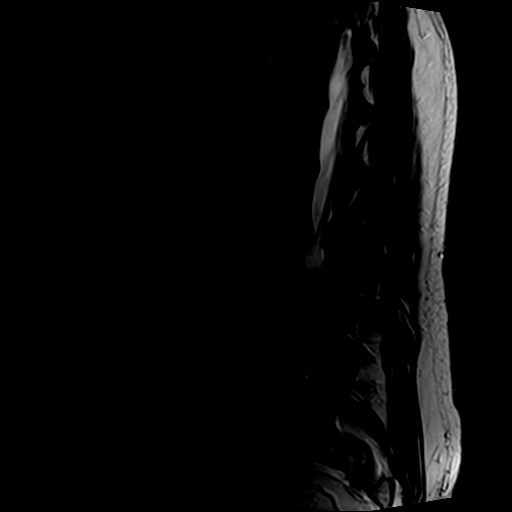
[im 9/15]
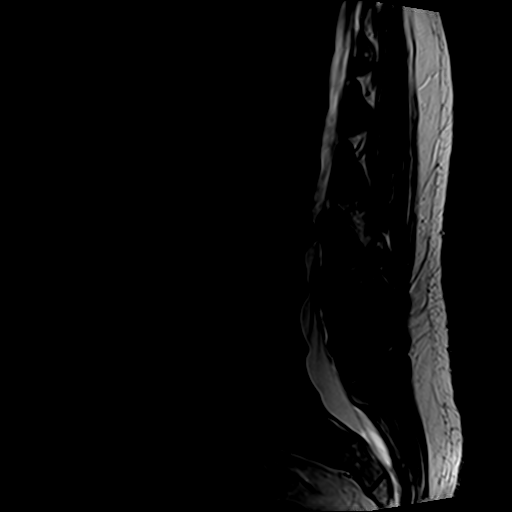
[im 12/15]
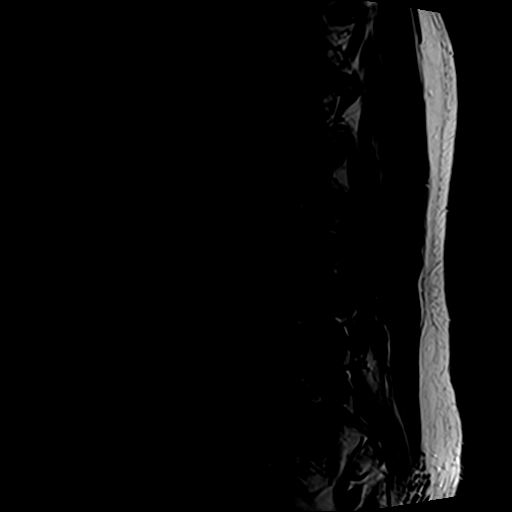
[im 15/15]
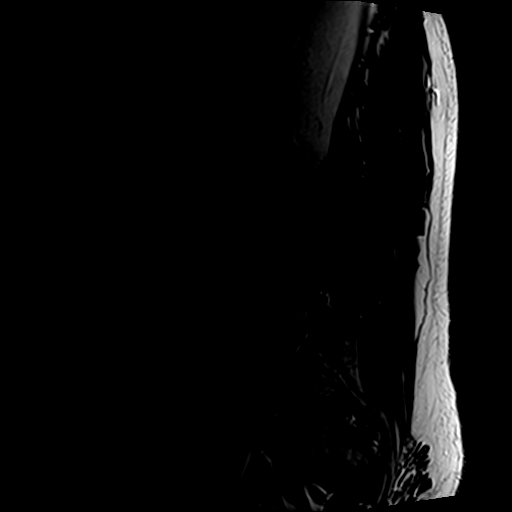

[Series 4: T1 · sagittal · 4.0mm · 0.55mm/px · 6 of 15 slices shown (1 of 2)]
[im 1/15]
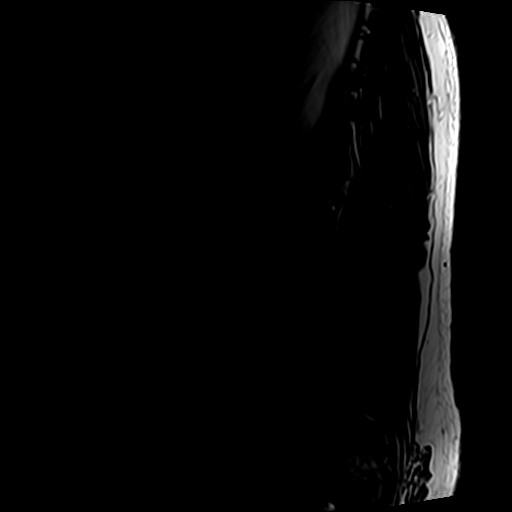
[im 3/15]
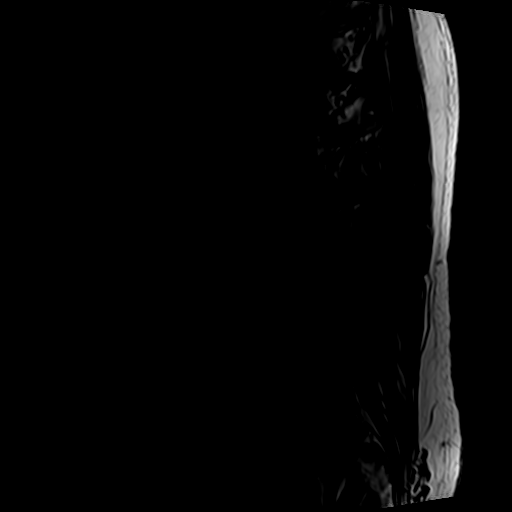
[im 6/15]
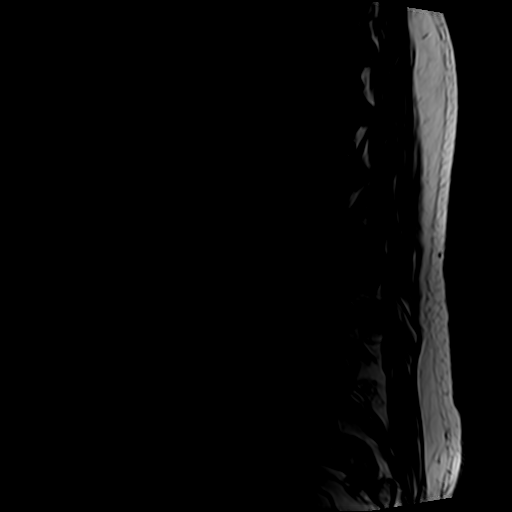
[im 9/15]
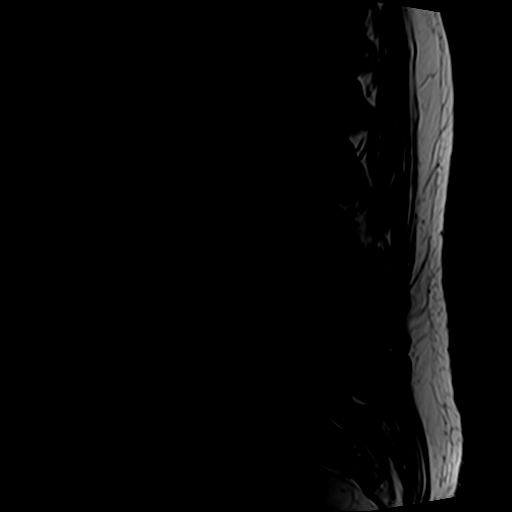
[im 12/15]
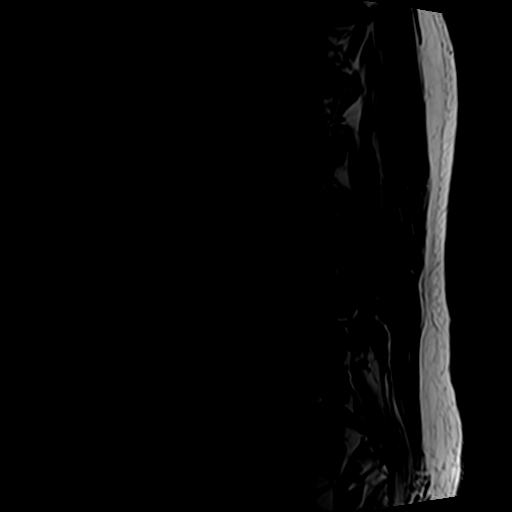
[im 15/15]
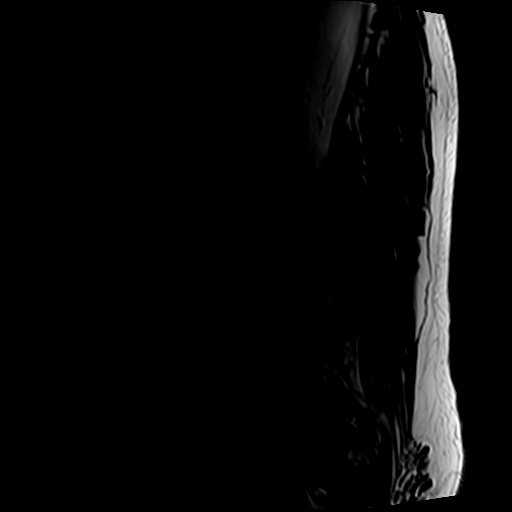

[Series 6: T2 · axial · 4.0mm · 0.70mm/px · z∈[-142,+71]mm · 9 of 39 slices shown (2 of 2)]
[im 1/39]
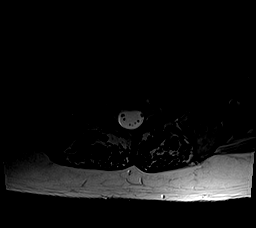
[im 6/39]
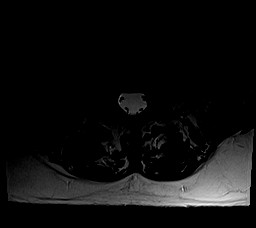
[im 11/39]
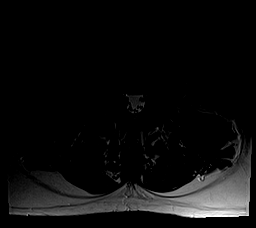
[im 17/39]
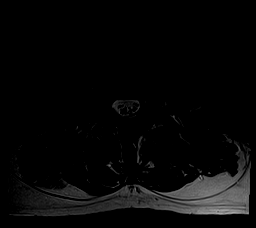
[im 20/39]
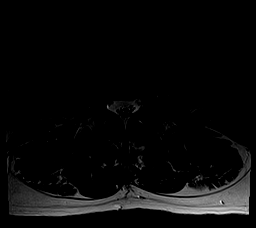
[im 22/39]
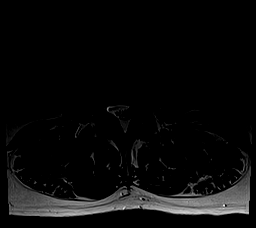
[im 28/39]
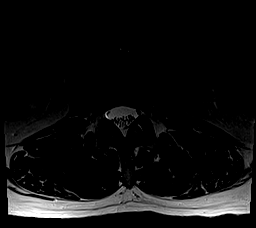
[im 33/39]
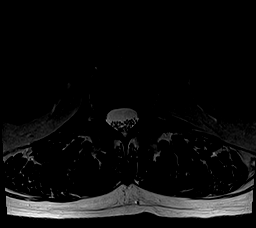
[im 39/39]
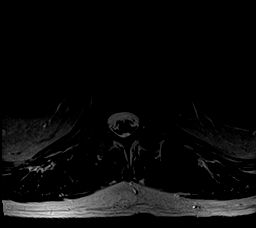

[Series 7: T1 · axial · 4.0mm · 0.35mm/px · z∈[-142,+41]mm · 4 of 39 slices shown (2 of 2)]
[im 1/39]
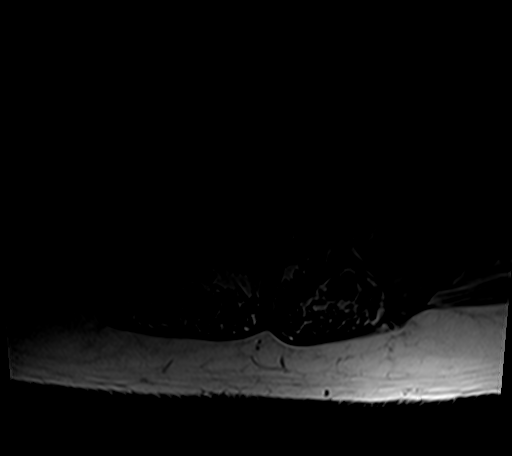
[im 6/39]
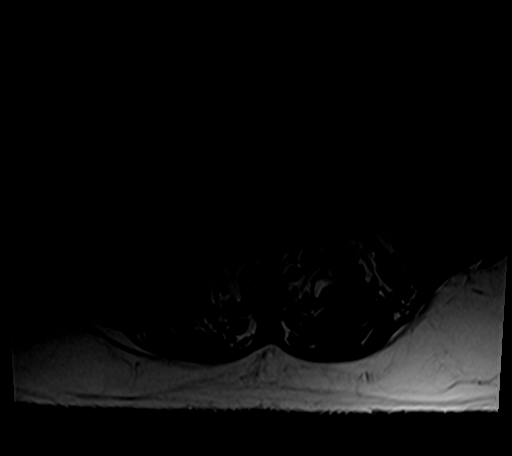
[im 20/39]
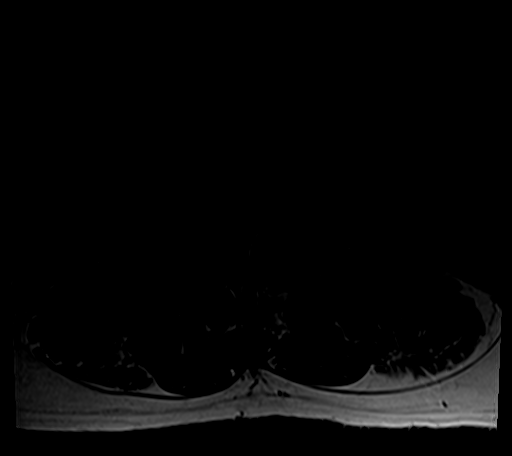
[im 33/39]
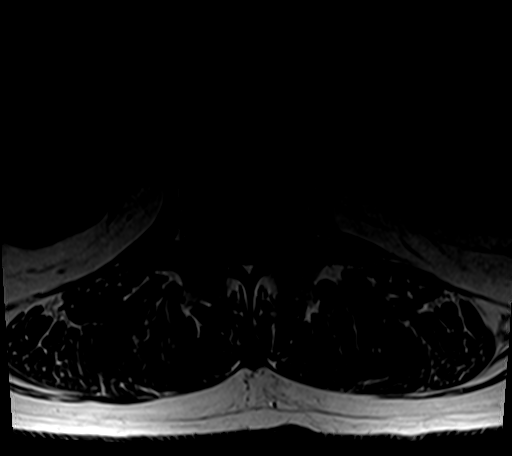

[25 of 48 positions shown; findings below may reference images not displayed]

FINDINGS: Segmentation: Normal on the prior CT which is the same numbering
system used on the 7777 MRI.

Alignment: Stable lumbar lordosis since 7777. Mild dextroconvex
lumbar scoliosis appears increased. No spondylolisthesis.

Vertebrae: Progressed endplate degeneration at L2-L3 since 7777,
with mild left far lateral degenerative appearing endplate marrow
edema on series 5, image 10. Background bone marrow signal within
normal limits. Intact visible sacrum and SI joints. No other acute
osseous abnormality identified.

Conus medullaris and cauda equina: Conus extends to the T12 level.
No lower spinal cord or conus signal abnormality.

Paraspinal and other soft tissues: Benign left renal midpole cyst.
Small benign appearing right renal cortical cysts. Negative
visualized posterior paraspinal soft tissues.

Disc levels:

T11-T12: Mild disc bulge, no stenosis.

T12-L1:  Negative.

L1-L2: Minimal disc bulge and posterior element hypertrophy. No
stenosis.

L2-L3: Progressed disc desiccation and disc space loss since 7777.
Chronic but increased left eccentric circumferential disc bulge with
left far lateral component. Mild to moderate posterior element
hypertrophy. Mild to moderate left lateral recess stenosis is new
(descending left L3 nerve level). Moderate left L2 foraminal
stenosis is new. Overall mild spinal stenosis.

L3-L4: Chronic circumferential disc bulge with up to moderate
posterior element hypertrophy. Mild epidural lipomatosis. Mild
spinal stenosis. Borderline to mild bilateral lateral recess
stenosis (L4 nerve levels). Mild to moderate left and mild right L3
foraminal stenosis. This level has not significantly changed.

L4-L5: Chronic circumferential disc bulge and endplate spurring
eccentric to the right with broad-based posterior component.
Moderate posterior element hypertrophy. Mild right lateral recess
stenosis. No significant spinal stenosis. Moderate bilateral L4
foraminal stenosis. This level is stable.

L5-S1:  Negative aside from mild facet hypertrophy.
IMPRESSION: 1. Progressed since 7777 L2-L3 disc and endplate degeneration
eccentric to the left. New mild spinal stenosis, mild to moderate
left lateral recess and moderate left foraminal stenosis.
2. No significant change in chronic L3-L4 and L4-L5 degeneration
with up to mild spinal stenosis, mild lateral recess stenosis and
moderate foraminal stenosis at each level.

## 2020-06-14 DIAGNOSIS — G629 Polyneuropathy, unspecified: Secondary | ICD-10-CM | POA: Diagnosis not present

## 2020-06-14 DIAGNOSIS — M722 Plantar fascial fibromatosis: Secondary | ICD-10-CM | POA: Diagnosis not present

## 2020-06-21 ENCOUNTER — Other Ambulatory Visit: Payer: Self-pay

## 2020-06-21 ENCOUNTER — Ambulatory Visit (INDEPENDENT_AMBULATORY_CARE_PROVIDER_SITE_OTHER): Payer: Medicare Other | Admitting: Orthopaedic Surgery

## 2020-06-21 ENCOUNTER — Encounter: Payer: Self-pay | Admitting: Orthopaedic Surgery

## 2020-06-21 DIAGNOSIS — M79672 Pain in left foot: Secondary | ICD-10-CM | POA: Diagnosis not present

## 2020-06-21 DIAGNOSIS — M79671 Pain in right foot: Secondary | ICD-10-CM | POA: Diagnosis not present

## 2020-06-21 NOTE — Progress Notes (Signed)
Office Visit Note   Patient: Kirk Brown           Date of Birth: [birth year]           MRN: [id number] Visit Date: 06/21/2020              Requested by: Jani Gravel, [address] [place],  [place] [id number] PCP: Janie Morning, DO   Assessment & Plan: Visit Diagnoses:  1. Bilateral foot pain     Plan: Kirk Brown has a history of diabetes presently treated with Amaryl and Metformin.  He does have an established diagnosis of diabetic neuropathy with burning and pain in both of his feet.  He was recently evaluated with a "scan" by a chiropractor that indicated multiple joint problems and plantar fasciitis.  It was suggested that he consider arch supports and is here for further evaluation of the painful feet.  By my exam the did not have plantar fasciitis and actually had good arches with weightbearing.  He had good capillary refill to his toes and +1 pulses.  He has very limited loss of sensation to his feet.  He has had a prior right ankle fusion without any obvious problems.  There was no pain on the plantar aspect of either foot.  He did not think he needed formal arch supports but did try the soft full innersoles from the office and he thought they were comfortable.  He may use these in any of his shoes.  I will plan to see him back as needed.  Follow-Up Instructions: Return if symptoms worsen or fail to improve.   Orders:  No orders of the defined types were placed in this encounter.  No orders of the defined types were placed in this encounter.     Procedures: No procedures performed   Clinical Data: No additional findings.   Subjective: Chief Complaint  Patient presents with  . Left Foot - Pain  . Right Foot - Pain  Patient presents today for bilateral foot pain. He has bilateral neuropathy. He has had previous right ankle surgery with Dr.Hewitt. He takes Morphine for pain.  Has history of diabetic neuropathy and relates that he has had gabapentin in  the past.  I do not see that on his present list of medicines.  He has had a prior ankle fusion by Dr. Doran Durand with limited motion.  He does not have specific plantar pain.  He was recently evaluated by the chiropractor with a "scan" that indicated he might have f plantar fasciitis and recommended that he have arch supports.    HPI  Review of Systems   Objective: Vital Signs: Ht 5\' 11"  (1.803 m)   Wt 205 lb (93 kg)   BMI 28.59 kg/m   Physical Exam Constitutional:      Appearance: He is well-developed and well-nourished.  HENT:     Mouth/Throat:     Mouth: Oropharynx is clear and moist.  Eyes:     Extraocular Movements: EOM normal.     Pupils: Pupils are equal, round, and reactive to light.  Pulmonary:     Effort: Pulmonary effort is normal.  Skin:    General: Skin is warm and dry.  Neurological:     Mental Status: He is alert and oriented to person, place, and time.  Psychiatric:        Mood and Affect: Mood and affect normal.        Behavior: Behavior normal.  Ortho Exam right ankle with limited dorsiflexion and plantarflexion.  Has had a prior ankle fusion.  No pain without motion.  He is able to stand on his toes with intact Achilles tendons.  Posterior tibial tendon function appears to be intact.  With full weightbearing he still maintains an arch and has no pain in the arch or over the plantar aspect of either heel.  +1 pulses.  The toes are warm with good capillary refill.  He notes he has feeling but might be slightly limited based on his neuropathy.  He did not have any specific pain to touch about the right ankle or foot or left ankle or foot  Specialty Comments:  No specialty comments available.  Imaging: No results found.   PMFS History: Patient Active Problem List   Diagnosis Date Noted  . Bilateral foot pain 06/21/2020  . Dysphonia on examination 01/11/2019  . Complaint related to dreams 01/11/2019  . Chronically on opiate therapy 01/11/2019  . Former  smoker, stopped smoking in distant past 01/11/2019  . Benzodiazepine contract exists 01/11/2019  . Other malaise and fatigue 11/20/2012  . Obesity, unspecified 10/08/2012  . Metabolic syndrome 09/62/8366  . DM (diabetes mellitus) ([place]) 10/08/2012  . Hyperglycemia 10/08/2012  . Deep inguinal pain 10/08/2012  . Gout 09/30/2012  . HTN (hypertension) 09/30/2012  . Other and unspecified hyperlipidemia 09/30/2012   Past Medical History:  Diagnosis Date  . Arthritis   . Chronic back pain   . Diabetes mellitus without complication ([place])   . Gout   . Hyperlipemia   . Hypertension   . RLS (restless legs syndrome)   . Wears glasses     Family History  Problem Relation Age of Onset  . Kidney Stones Mother   . Hyperlipidemia Mother   . Diabetes Mother   . Alzheimer's disease Mother   . Kidney Stones Sister     Past Surgical History:  Procedure Laterality Date  . ANKLE ARTHROSCOPY WITH ARTHRODESIS  03/12/2012   Procedure: ANKLE ARTHROSCOPY WITH ARTHRODESIS;  Surgeon: Wylene Simmer, MD;  Location: [facility];  Service: Orthopedics;  Laterality: Right;  Right Ankle Arthroscopy with Arthrodesis and  Bone Marrow Aspiration Right Hip (BMAC)  . CARPAL TUNNEL RELEASE     both hands  . COLONOSCOPY    . ELBOW ARTHROSCOPY     both  . INSERTION OF MESH N/A 11/02/2012   Procedure: INSERTION OF MESH;  Surgeon: Madilyn Hook, DO;  Location: [facility];  Service: General;  Laterality: N/A;  . LAPAROSCOPY N/A 11/02/2012   Procedure: LAPAROSCOPY DIAGNOSTIC  bilateral inguinal hernia ;  Surgeon: Madilyn Hook, DO;  Location: [facility];  Service: General;  Laterality: N/A;  . right fracture      right leg compound fracture-age 70  . SHOULDER ARTHROSCOPY     lt  . TONSILLECTOMY    . UMBILICAL HERNIA REPAIR N/A 11/02/2012   Procedure: HERNIA REPAIR UMBILICAL ADULT;  Surgeon: Madilyn Hook, DO;  Location: [facility];  Service: General;  Laterality: N/A;   Social History   Occupational History  . Not on  file  Tobacco Use  . Smoking status: Current Every Day Smoker    Types: Cigarettes    Last attempt to quit: 03/09/1989    Years since quitting: 31.3  . Smokeless tobacco: Never Used  Substance and Sexual Activity  . Alcohol use: Yes    Alcohol/week: 3.0 standard drinks    Types: 3 Cans of beer per week  Comment: beer  . Drug use: No  . Sexual activity: Not on file

## 2020-10-13 DIAGNOSIS — E118 Type 2 diabetes mellitus with unspecified complications: Secondary | ICD-10-CM | POA: Diagnosis not present

## 2020-10-13 DIAGNOSIS — R7989 Other specified abnormal findings of blood chemistry: Secondary | ICD-10-CM | POA: Diagnosis not present

## 2020-10-13 DIAGNOSIS — Z79899 Other long term (current) drug therapy: Secondary | ICD-10-CM | POA: Diagnosis not present

## 2020-10-13 DIAGNOSIS — Z0000 Encounter for general adult medical examination without abnormal findings: Secondary | ICD-10-CM | POA: Diagnosis not present

## 2020-10-13 DIAGNOSIS — E785 Hyperlipidemia, unspecified: Secondary | ICD-10-CM | POA: Diagnosis not present

## 2020-10-13 DIAGNOSIS — M109 Gout, unspecified: Secondary | ICD-10-CM | POA: Diagnosis not present

## 2020-10-20 DIAGNOSIS — Z0000 Encounter for general adult medical examination without abnormal findings: Secondary | ICD-10-CM | POA: Diagnosis not present

## 2020-10-20 DIAGNOSIS — G8929 Other chronic pain: Secondary | ICD-10-CM | POA: Diagnosis not present

## 2020-10-20 DIAGNOSIS — I10 Essential (primary) hypertension: Secondary | ICD-10-CM | POA: Diagnosis not present

## 2020-10-20 DIAGNOSIS — E785 Hyperlipidemia, unspecified: Secondary | ICD-10-CM | POA: Diagnosis not present

## 2020-10-20 DIAGNOSIS — E1140 Type 2 diabetes mellitus with diabetic neuropathy, unspecified: Secondary | ICD-10-CM | POA: Diagnosis not present

## 2020-10-20 DIAGNOSIS — Z23 Encounter for immunization: Secondary | ICD-10-CM | POA: Diagnosis not present

## 2020-10-20 DIAGNOSIS — D649 Anemia, unspecified: Secondary | ICD-10-CM | POA: Diagnosis not present

## 2021-01-17 DIAGNOSIS — Z23 Encounter for immunization: Secondary | ICD-10-CM | POA: Diagnosis not present

## 2021-01-31 DIAGNOSIS — H2513 Age-related nuclear cataract, bilateral: Secondary | ICD-10-CM | POA: Diagnosis not present

## 2021-01-31 DIAGNOSIS — H40033 Anatomical narrow angle, bilateral: Secondary | ICD-10-CM | POA: Diagnosis not present

## 2021-04-18 DIAGNOSIS — E291 Testicular hypofunction: Secondary | ICD-10-CM | POA: Diagnosis not present

## 2021-04-18 DIAGNOSIS — E785 Hyperlipidemia, unspecified: Secondary | ICD-10-CM | POA: Diagnosis not present

## 2021-04-18 DIAGNOSIS — R5383 Other fatigue: Secondary | ICD-10-CM | POA: Diagnosis not present

## 2021-04-18 DIAGNOSIS — D649 Anemia, unspecified: Secondary | ICD-10-CM | POA: Diagnosis not present

## 2021-04-18 DIAGNOSIS — Z79899 Other long term (current) drug therapy: Secondary | ICD-10-CM | POA: Diagnosis not present

## 2021-04-18 DIAGNOSIS — E1140 Type 2 diabetes mellitus with diabetic neuropathy, unspecified: Secondary | ICD-10-CM | POA: Diagnosis not present

## 2021-04-18 DIAGNOSIS — G8929 Other chronic pain: Secondary | ICD-10-CM | POA: Diagnosis not present

## 2021-04-18 DIAGNOSIS — R6889 Other general symptoms and signs: Secondary | ICD-10-CM | POA: Diagnosis not present

## 2021-04-25 DIAGNOSIS — E1140 Type 2 diabetes mellitus with diabetic neuropathy, unspecified: Secondary | ICD-10-CM | POA: Diagnosis not present

## 2021-04-25 DIAGNOSIS — G8929 Other chronic pain: Secondary | ICD-10-CM | POA: Diagnosis not present

## 2021-04-25 DIAGNOSIS — M109 Gout, unspecified: Secondary | ICD-10-CM | POA: Diagnosis not present

## 2021-04-25 DIAGNOSIS — D649 Anemia, unspecified: Secondary | ICD-10-CM | POA: Diagnosis not present

## 2021-04-25 DIAGNOSIS — J320 Chronic maxillary sinusitis: Secondary | ICD-10-CM | POA: Diagnosis not present

## 2021-04-25 DIAGNOSIS — E785 Hyperlipidemia, unspecified: Secondary | ICD-10-CM | POA: Diagnosis not present

## 2021-04-25 DIAGNOSIS — G629 Polyneuropathy, unspecified: Secondary | ICD-10-CM | POA: Diagnosis not present

## 2021-04-25 DIAGNOSIS — E291 Testicular hypofunction: Secondary | ICD-10-CM | POA: Diagnosis not present

## 2021-04-25 DIAGNOSIS — I10 Essential (primary) hypertension: Secondary | ICD-10-CM | POA: Diagnosis not present

## 2021-04-25 DIAGNOSIS — F330 Major depressive disorder, recurrent, mild: Secondary | ICD-10-CM | POA: Diagnosis not present

## 2021-04-25 DIAGNOSIS — Z79899 Other long term (current) drug therapy: Secondary | ICD-10-CM | POA: Diagnosis not present

## 2021-04-25 DIAGNOSIS — F419 Anxiety disorder, unspecified: Secondary | ICD-10-CM | POA: Diagnosis not present

## 2021-05-23 DIAGNOSIS — J342 Deviated nasal septum: Secondary | ICD-10-CM | POA: Diagnosis not present

## 2021-05-23 DIAGNOSIS — R519 Headache, unspecified: Secondary | ICD-10-CM | POA: Diagnosis not present

## 2021-05-23 DIAGNOSIS — R0981 Nasal congestion: Secondary | ICD-10-CM | POA: Diagnosis not present

## 2021-05-23 DIAGNOSIS — J321 Chronic frontal sinusitis: Secondary | ICD-10-CM | POA: Diagnosis not present

## 2021-08-25 DIAGNOSIS — M109 Gout, unspecified: Secondary | ICD-10-CM | POA: Diagnosis not present

## 2021-08-27 DIAGNOSIS — M109 Gout, unspecified: Secondary | ICD-10-CM | POA: Diagnosis not present

## 2021-08-29 DIAGNOSIS — H6123 Impacted cerumen, bilateral: Secondary | ICD-10-CM | POA: Diagnosis not present

## 2021-08-29 DIAGNOSIS — R0989 Other specified symptoms and signs involving the circulatory and respiratory systems: Secondary | ICD-10-CM | POA: Diagnosis not present

## 2021-08-29 DIAGNOSIS — F17200 Nicotine dependence, unspecified, uncomplicated: Secondary | ICD-10-CM | POA: Diagnosis not present

## 2021-10-03 DIAGNOSIS — E1140 Type 2 diabetes mellitus with diabetic neuropathy, unspecified: Secondary | ICD-10-CM | POA: Diagnosis not present

## 2021-10-03 DIAGNOSIS — D229 Melanocytic nevi, unspecified: Secondary | ICD-10-CM | POA: Diagnosis not present

## 2021-10-03 DIAGNOSIS — R739 Hyperglycemia, unspecified: Secondary | ICD-10-CM | POA: Diagnosis not present

## 2021-10-17 DIAGNOSIS — M109 Gout, unspecified: Secondary | ICD-10-CM | POA: Diagnosis not present

## 2021-10-17 DIAGNOSIS — E291 Testicular hypofunction: Secondary | ICD-10-CM | POA: Diagnosis not present

## 2021-10-17 DIAGNOSIS — E1140 Type 2 diabetes mellitus with diabetic neuropathy, unspecified: Secondary | ICD-10-CM | POA: Diagnosis not present

## 2021-10-17 DIAGNOSIS — E785 Hyperlipidemia, unspecified: Secondary | ICD-10-CM | POA: Diagnosis not present

## 2021-10-17 DIAGNOSIS — Z79899 Other long term (current) drug therapy: Secondary | ICD-10-CM | POA: Diagnosis not present

## 2021-10-17 DIAGNOSIS — D649 Anemia, unspecified: Secondary | ICD-10-CM | POA: Diagnosis not present

## 2021-10-31 DIAGNOSIS — E1140 Type 2 diabetes mellitus with diabetic neuropathy, unspecified: Secondary | ICD-10-CM | POA: Diagnosis not present

## 2021-10-31 DIAGNOSIS — D649 Anemia, unspecified: Secondary | ICD-10-CM | POA: Diagnosis not present

## 2021-10-31 DIAGNOSIS — G8929 Other chronic pain: Secondary | ICD-10-CM | POA: Diagnosis not present

## 2021-10-31 DIAGNOSIS — Z79899 Other long term (current) drug therapy: Secondary | ICD-10-CM | POA: Diagnosis not present

## 2021-10-31 DIAGNOSIS — E291 Testicular hypofunction: Secondary | ICD-10-CM | POA: Diagnosis not present

## 2021-10-31 DIAGNOSIS — E785 Hyperlipidemia, unspecified: Secondary | ICD-10-CM | POA: Diagnosis not present

## 2021-10-31 DIAGNOSIS — Z0000 Encounter for general adult medical examination without abnormal findings: Secondary | ICD-10-CM | POA: Diagnosis not present

## 2021-10-31 DIAGNOSIS — M109 Gout, unspecified: Secondary | ICD-10-CM | POA: Diagnosis not present

## 2021-10-31 DIAGNOSIS — Z125 Encounter for screening for malignant neoplasm of prostate: Secondary | ICD-10-CM | POA: Diagnosis not present

## 2021-10-31 DIAGNOSIS — I10 Essential (primary) hypertension: Secondary | ICD-10-CM | POA: Diagnosis not present

## 2022-02-04 DIAGNOSIS — H40033 Anatomical narrow angle, bilateral: Secondary | ICD-10-CM | POA: Diagnosis not present

## 2022-02-04 DIAGNOSIS — H2513 Age-related nuclear cataract, bilateral: Secondary | ICD-10-CM | POA: Diagnosis not present

## 2022-02-06 DIAGNOSIS — Z23 Encounter for immunization: Secondary | ICD-10-CM | POA: Diagnosis not present

## 2022-03-15 DIAGNOSIS — M9903 Segmental and somatic dysfunction of lumbar region: Secondary | ICD-10-CM | POA: Diagnosis not present

## 2022-03-15 DIAGNOSIS — M5451 Vertebrogenic low back pain: Secondary | ICD-10-CM | POA: Diagnosis not present

## 2022-04-03 DIAGNOSIS — M9903 Segmental and somatic dysfunction of lumbar region: Secondary | ICD-10-CM | POA: Diagnosis not present

## 2022-04-03 DIAGNOSIS — M5451 Vertebrogenic low back pain: Secondary | ICD-10-CM | POA: Diagnosis not present

## 2022-04-10 DIAGNOSIS — M5451 Vertebrogenic low back pain: Secondary | ICD-10-CM | POA: Diagnosis not present

## 2022-04-10 DIAGNOSIS — M9903 Segmental and somatic dysfunction of lumbar region: Secondary | ICD-10-CM | POA: Diagnosis not present

## 2022-04-17 DIAGNOSIS — M9903 Segmental and somatic dysfunction of lumbar region: Secondary | ICD-10-CM | POA: Diagnosis not present

## 2022-04-17 DIAGNOSIS — M5451 Vertebrogenic low back pain: Secondary | ICD-10-CM | POA: Diagnosis not present

## 2022-04-24 DIAGNOSIS — M9903 Segmental and somatic dysfunction of lumbar region: Secondary | ICD-10-CM | POA: Diagnosis not present

## 2022-04-24 DIAGNOSIS — M5451 Vertebrogenic low back pain: Secondary | ICD-10-CM | POA: Diagnosis not present

## 2022-05-01 DIAGNOSIS — M109 Gout, unspecified: Secondary | ICD-10-CM | POA: Diagnosis not present

## 2022-05-01 DIAGNOSIS — E785 Hyperlipidemia, unspecified: Secondary | ICD-10-CM | POA: Diagnosis not present

## 2022-05-01 DIAGNOSIS — Z79899 Other long term (current) drug therapy: Secondary | ICD-10-CM | POA: Diagnosis not present

## 2022-05-01 DIAGNOSIS — E1140 Type 2 diabetes mellitus with diabetic neuropathy, unspecified: Secondary | ICD-10-CM | POA: Diagnosis not present

## 2022-05-01 DIAGNOSIS — D649 Anemia, unspecified: Secondary | ICD-10-CM | POA: Diagnosis not present

## 2022-05-01 DIAGNOSIS — E291 Testicular hypofunction: Secondary | ICD-10-CM | POA: Diagnosis not present

## 2022-05-01 DIAGNOSIS — Z125 Encounter for screening for malignant neoplasm of prostate: Secondary | ICD-10-CM | POA: Diagnosis not present

## 2022-05-01 DIAGNOSIS — I10 Essential (primary) hypertension: Secondary | ICD-10-CM | POA: Diagnosis not present

## 2022-05-08 DIAGNOSIS — D649 Anemia, unspecified: Secondary | ICD-10-CM | POA: Diagnosis not present

## 2022-05-08 DIAGNOSIS — G8929 Other chronic pain: Secondary | ICD-10-CM | POA: Diagnosis not present

## 2022-05-08 DIAGNOSIS — I10 Essential (primary) hypertension: Secondary | ICD-10-CM | POA: Diagnosis not present

## 2022-05-08 DIAGNOSIS — E1140 Type 2 diabetes mellitus with diabetic neuropathy, unspecified: Secondary | ICD-10-CM | POA: Diagnosis not present

## 2022-05-08 DIAGNOSIS — Z79899 Other long term (current) drug therapy: Secondary | ICD-10-CM | POA: Diagnosis not present

## 2022-05-08 DIAGNOSIS — E291 Testicular hypofunction: Secondary | ICD-10-CM | POA: Diagnosis not present

## 2022-05-08 DIAGNOSIS — M109 Gout, unspecified: Secondary | ICD-10-CM | POA: Diagnosis not present

## 2022-05-08 DIAGNOSIS — E785 Hyperlipidemia, unspecified: Secondary | ICD-10-CM | POA: Diagnosis not present

## 2022-05-28 DIAGNOSIS — G9331 Postviral fatigue syndrome: Secondary | ICD-10-CM | POA: Diagnosis not present

## 2022-06-19 DIAGNOSIS — R29898 Other symptoms and signs involving the musculoskeletal system: Secondary | ICD-10-CM | POA: Diagnosis not present

## 2022-06-19 DIAGNOSIS — R5383 Other fatigue: Secondary | ICD-10-CM | POA: Diagnosis not present

## 2022-06-19 DIAGNOSIS — E291 Testicular hypofunction: Secondary | ICD-10-CM | POA: Diagnosis not present

## 2022-10-24 ENCOUNTER — Emergency Department (HOSPITAL_BASED_OUTPATIENT_CLINIC_OR_DEPARTMENT_OTHER): Payer: PPO

## 2022-10-24 ENCOUNTER — Encounter (HOSPITAL_BASED_OUTPATIENT_CLINIC_OR_DEPARTMENT_OTHER): Payer: Self-pay

## 2022-10-24 ENCOUNTER — Other Ambulatory Visit: Payer: Self-pay

## 2022-10-24 ENCOUNTER — Inpatient Hospital Stay (HOSPITAL_BASED_OUTPATIENT_CLINIC_OR_DEPARTMENT_OTHER)
Admission: EM | Admit: 2022-10-24 | Discharge: 2022-10-26 | DRG: 682 | Disposition: A | Discharge status: Home / Self Care | Payer: PPO | Attending: Internal Medicine | Admitting: Internal Medicine

## 2022-10-24 DIAGNOSIS — E1165 Type 2 diabetes mellitus with hyperglycemia: Secondary | ICD-10-CM | POA: Diagnosis present

## 2022-10-24 DIAGNOSIS — Z79899 Other long term (current) drug therapy: Secondary | ICD-10-CM

## 2022-10-24 DIAGNOSIS — R400 Somnolence: Secondary | ICD-10-CM

## 2022-10-24 DIAGNOSIS — I10 Essential (primary) hypertension: Secondary | ICD-10-CM | POA: Diagnosis present

## 2022-10-24 DIAGNOSIS — M109 Gout, unspecified: Secondary | ICD-10-CM | POA: Diagnosis not present

## 2022-10-24 DIAGNOSIS — Z91148 Patient's other noncompliance with medication regimen for other reason: Secondary | ICD-10-CM

## 2022-10-24 DIAGNOSIS — M1990 Unspecified osteoarthritis, unspecified site: Secondary | ICD-10-CM | POA: Diagnosis not present

## 2022-10-24 DIAGNOSIS — E1140 Type 2 diabetes mellitus with diabetic neuropathy, unspecified: Secondary | ICD-10-CM | POA: Diagnosis present

## 2022-10-24 DIAGNOSIS — Z7984 Long term (current) use of oral hypoglycemic drugs: Secondary | ICD-10-CM | POA: Diagnosis not present

## 2022-10-24 DIAGNOSIS — R29818 Other symptoms and signs involving the nervous system: Secondary | ICD-10-CM | POA: Diagnosis not present

## 2022-10-24 DIAGNOSIS — Z83438 Family history of other disorder of lipoprotein metabolism and other lipidemia: Secondary | ICD-10-CM

## 2022-10-24 DIAGNOSIS — N179 Acute kidney failure, unspecified: Secondary | ICD-10-CM | POA: Diagnosis not present

## 2022-10-24 DIAGNOSIS — Z833 Family history of diabetes mellitus: Secondary | ICD-10-CM

## 2022-10-24 DIAGNOSIS — G894 Chronic pain syndrome: Secondary | ICD-10-CM | POA: Diagnosis present

## 2022-10-24 DIAGNOSIS — E860 Dehydration: Secondary | ICD-10-CM | POA: Diagnosis present

## 2022-10-24 DIAGNOSIS — G2581 Restless legs syndrome: Secondary | ICD-10-CM | POA: Diagnosis not present

## 2022-10-24 DIAGNOSIS — M545 Low back pain, unspecified: Secondary | ICD-10-CM | POA: Diagnosis present

## 2022-10-24 DIAGNOSIS — D72829 Elevated white blood cell count, unspecified: Secondary | ICD-10-CM | POA: Diagnosis not present

## 2022-10-24 DIAGNOSIS — E785 Hyperlipidemia, unspecified: Secondary | ICD-10-CM | POA: Diagnosis not present

## 2022-10-24 DIAGNOSIS — Z79891 Long term (current) use of opiate analgesic: Secondary | ICD-10-CM | POA: Diagnosis not present

## 2022-10-24 DIAGNOSIS — F411 Generalized anxiety disorder: Secondary | ICD-10-CM | POA: Diagnosis not present

## 2022-10-24 DIAGNOSIS — G9341 Metabolic encephalopathy: Secondary | ICD-10-CM | POA: Diagnosis not present

## 2022-10-24 DIAGNOSIS — F17210 Nicotine dependence, cigarettes, uncomplicated: Secondary | ICD-10-CM | POA: Diagnosis not present

## 2022-10-24 DIAGNOSIS — M79652 Pain in left thigh: Secondary | ICD-10-CM | POA: Diagnosis present

## 2022-10-24 DIAGNOSIS — G4710 Hypersomnia, unspecified: Secondary | ICD-10-CM | POA: Diagnosis present

## 2022-10-24 DIAGNOSIS — R531 Weakness: Secondary | ICD-10-CM

## 2022-10-24 DIAGNOSIS — G4731 Primary central sleep apnea: Secondary | ICD-10-CM | POA: Diagnosis not present

## 2022-10-24 LAB — CBC
HCT: 42.2 % (ref 39.0–52.0)
Hemoglobin: 14.4 g/dL (ref 13.0–17.0)
MCH: 31 pg (ref 26.0–34.0)
MCHC: 34.1 g/dL (ref 30.0–36.0)
MCV: 90.9 fL (ref 80.0–100.0)
Platelets: 224 10*3/uL (ref 150–400)
RBC: 4.64 MIL/uL (ref 4.22–5.81)
RDW: 13.4 % (ref 11.5–15.5)
WBC: 13.7 10*3/uL — ABNORMAL HIGH (ref 4.0–10.5)
nRBC: 0 % (ref 0.0–0.2)

## 2022-10-24 LAB — DIFFERENTIAL
Abs Immature Granulocytes: 0.11 10*3/uL — ABNORMAL HIGH (ref 0.00–0.07)
Basophils Absolute: 0 10*3/uL (ref 0.0–0.1)
Basophils Relative: 0 %
Eosinophils Absolute: 0 10*3/uL (ref 0.0–0.5)
Eosinophils Relative: 0 %
Immature Granulocytes: 1 %
Lymphocytes Relative: 14 %
Lymphs Abs: 1.9 10*3/uL (ref 0.7–4.0)
Monocytes Absolute: 1 10*3/uL (ref 0.1–1.0)
Monocytes Relative: 7 %
Neutro Abs: 10.7 10*3/uL — ABNORMAL HIGH (ref 1.7–7.7)
Neutrophils Relative %: 78 %

## 2022-10-24 LAB — PROTIME-INR
INR: 1 (ref 0.8–1.2)
Prothrombin Time: 13.6 seconds (ref 11.4–15.2)

## 2022-10-24 LAB — COMPREHENSIVE METABOLIC PANEL
ALT: 20 U/L (ref 0–44)
AST: 24 U/L (ref 15–41)
Albumin: 4.8 g/dL (ref 3.5–5.0)
Alkaline Phosphatase: 39 U/L (ref 38–126)
Anion gap: 15 (ref 5–15)
BUN: 31 mg/dL — ABNORMAL HIGH (ref 8–23)
CO2: 25 mmol/L (ref 22–32)
Calcium: 9.7 mg/dL (ref 8.9–10.3)
Chloride: 102 mmol/L (ref 98–111)
Creatinine, Ser: 2.05 mg/dL — ABNORMAL HIGH (ref 0.61–1.24)
GFR, Estimated: 34 mL/min — ABNORMAL LOW (ref >60)
Glucose, Bld: 173 mg/dL — ABNORMAL HIGH (ref 70–99)
Potassium: 4.1 mmol/L (ref 3.5–5.1)
Sodium: 142 mmol/L (ref 135–145)
Total Bilirubin: 0.5 mg/dL (ref 0.3–1.2)
Total Protein: 8 g/dL (ref 6.5–8.1)

## 2022-10-24 LAB — APTT: aPTT: 29 seconds (ref 24–36)

## 2022-10-24 LAB — ETHANOL: Alcohol, Ethyl (B): <10 mg/dL (ref <10)

## 2022-10-24 LAB — CBG MONITORING, ED: Glucose-Capillary: 166 mg/dL — ABNORMAL HIGH (ref 70–99)

## 2022-10-24 MED ORDER — SODIUM CHLORIDE 0.9 % IV BOLUS
500.0000 mL | Freq: Once | INTRAVENOUS | Status: AC
  Administered 2022-10-24: 500 mL via INTRAVENOUS

## 2022-10-24 NOTE — Consult Note (Signed)
[facility] Consult Services   Patient Name:   Kirk Brown, Kirk Brown Date of Birth:   1950-05-16 Identification Number:   MRN - [id number] Date of Service:   10/24/2022 20:26:55  Diagnosis:       I63.89 - Cerebrovascular accident (CVA) due to other mechanism [facility])  Impression:      72 yo man presenting for left sided heaviness and difficulty walking. He definitely does have difficulty standing and walking. Stroke is possible but he is outside the time window for thrombolytics. Recommend admission and brain MRI. Place on aspirin and plavix with 300mg  plavix load.  Our recommendations are outlined below.  Recommendations:        Stroke/Telemetry Floor       Neuro Checks       Bedside Swallow Eval       DVT Prophylaxis       IV Fluids, Normal Saline       Head of Bed 30 Degrees       Euglycemia and Avoid Hyperthermia (PRN Acetaminophen)       Bolus with Clopidogrel 300 mg bolus x1 and initiate dual antiplatelet therapy with Aspirin 81 mg daily and Clopidogrel 75 mg daily  Sign Out:       Discussed with Emergency Department Provider    ------------------------------------------------------------------------------  Advanced Imaging: Advanced Imaging Deferred because:  low suspicion of LVO   Metrics: Last Known Well: Unknown [facility] Notification Time: 10/24/2022 20:26:55 Arrival Time: 10/24/2022 20:10:00 Stamp Time: 10/24/2022 20:26:55 Initial Response Time: 10/24/2022 20:29:43 Symptoms: left sided heaviness, difficulty walking. Initial patient interaction: 10/24/2022 20:33:25 NIHSS Assessment Completed: 10/24/2022 20:43:43 Patient is not a candidate for Thrombolytic. Thrombolytic Medical Decision: 10/24/2022 20:52:40 Patient was not deemed candidate for Thrombolytic because of following reasons: Last Well Known Above 4.5 Hours.  CT head showed no acute hemorrhage or acute core infarct.  Primary Provider Notified of Diagnostic Impression and  Management Plan on: 10/24/2022 20:53:58    ------------------------------------------------------------------------------  History of Present Illness: Patient is a 73 year old Male.  Patient was brought by private transportation with symptoms of left sided heaviness, difficulty walking. This is a 72 yo man who presents for evaluation of complaints of left sided heaviness. At first, we were told that the symptoms occurred at 18:30 while he was showering. He continued with this history for a while but then later said earlier at church, he was feeling strange and that his left leg was weak.  Currently, there are no obvious focal deficits (other than mild left numbness), but he does have difficulty walking. LLE is buckling and he needs the assistance of his nurse to stay upright. He is also becoming somnolent during the history and falling asleep.  He has a history of HTN, DM, HLD.   Past Medical History:      Hypertension      Diabetes Mellitus      Hyperlipidemia [place] PMH:  anxiety  Medications:  No Anticoagulant use  No Antiplatelet use Reviewed EMR for current medications  Allergies:  NKDA  Social History: Smoking: No  Family History:  There is no family history of premature cerebrovascular disease pertinent to this consultation  ROS : 14 Points Review of Systems was performed and was negative except mentioned in HPI.  Past Surgical History: There Is No Surgical History Contributory To Today's Visit    Examination: BP(137/89), Pulse(107), Blood Glucose(166) 1A: Level of Consciousness - Alert; keenly responsive + 0 1B: Ask Month and Age - Both Questions Right + 0 1C:  Blink Eyes & Squeeze Hands - Performs Both Tasks + 0 2: Test Horizontal Extraocular Movements - Normal + 0 3: Test Visual Fields - No Visual Loss + 0 4: Test Facial Palsy (Use Grimace if Obtunded) - Normal symmetry + 0 5A: Test Left Arm Motor Drift - No Drift for 10 Seconds + 0 5B: Test Right Arm  Motor Drift - No Drift for 10 Seconds + 0 6A: Test Left Leg Motor Drift - No Drift for 5 Seconds + 0 6B: Test Right Leg Motor Drift - No Drift for 5 Seconds + 0 7: Test Limb Ataxia (FNF/Heel-Shin) - No Ataxia + 0 8: Test Sensation - Normal; No sensory loss + 0 9: Test Language/Aphasia - Normal; No aphasia + 0 10: Test Dysarthria - Normal + 0 11: Test Extinction/Inattention - No abnormality + 0  NIHSS Score: 0  NIHSS Free Text : difficulty walking, left leg giving out  Pre-Morbid Modified Rankin Scale: 0 Points = No symptoms at all  Spoke with : Dr. Charm Barges  This consult was conducted in real time using interactive audio and Immunologist. Patient was informed of the technology being used for this visit and agreed to proceed. Patient located in hospital and provider located at home/office setting.   Patient is being evaluated for possible acute neurologic impairment and high probability of imminent or life-threatening deterioration. I spent total of 40 minutes providing care to this patient, including time for face to face visit via telemedicine, review of medical records, imaging studies and discussion of findings with providers, the patient and/or family.   Dr Parthenia Ames   [facility] For Inpatient follow-up with [facility] physician please call [facility] [phone]. This is not an outpatient service. Post hospital discharge, please contact hospital directly.  Please do not communicate with [facility] physicians via secure chat. If you have any questions, Please contact [facility]. Please call or reconsult our service if there are any clinical or diagnostic changes.

## 2022-10-24 NOTE — Progress Notes (Addendum)
2014 - Code Stroke Activated  mRS 0, LKWT 1830 2022 - Patient to CT  2024 - Arrived in CT 2026 - TSMD paged 2029 - CT scan complete 2030 - Dr. Felix Ahmadi on stroke cart  2030 - Patient returned to room  NCCT results given to Dr. Felix Ahmadi on camera @2045  LKWT changed to unknown

## 2022-10-24 NOTE — ED Provider Notes (Signed)
[place] EMERGENCY DEPARTMENT AT [facility] Provider Note   CSN: [id number] Arrival date & time: 10/24/22  2010     History  Chief Complaint  Patient presents with   Code Stroke    Kirk Brown is a 72 y.o. male.  He is brought in from triage as a possible stroke activation.  He tells me around 630 tonight in the shower his left leg felt weak and he was unable to support himself.  He began experiencing headache.  He was also dropping things in his left hand and possibly had some slurred speech.  He also tells me that earlier in the day he felt like something did not feel right with his left side.  He cannot really give me an exact timeframe on that.  He took 2 aspirin and presented here for further evaluation.  He denies any numbness, blurry vision double vision.  The history is provided by the patient.  Cerebrovascular Accident This is a new problem. The current episode started 1 to 2 hours ago. The problem occurs constantly. The problem has been gradually improving. Associated symptoms include headaches. Pertinent negatives include no chest pain, no abdominal pain and no shortness of breath. The symptoms are aggravated by walking. Nothing relieves the symptoms. He has tried rest for the symptoms. The treatment provided no relief.       Home Medications Prior to Admission medications   Medication Sig Start Date End Date Taking? Authorizing Provider  allopurinol (ZYLOPRIM) 300 MG tablet Take 300 mg by mouth daily as needed (gout flare up). Patient not taking: Reported on 06/21/2020    [provider]  clonazePAM (KLONOPIN) 0.5 MG tablet Take 0.5 mg by mouth daily. 1 tablet, BID and 2 tab at bedtime 12/29/18   [provider]  glimepiride (AMARYL) 1 MG tablet Take 0.5 mg by mouth at bedtime.     [provider]  metFORMIN (GLUCOPHAGE) 500 MG tablet 500 mg 3 (three) times daily. 500 mg in AM and 2 tab in evening    [provider]  Morphine  Sulfate ER 30 MG TBEA Take by mouth.     [provider]  NARCAN 4 MG/0.1ML LIQD nasal spray kit  11/10/18   [provider]  simvastatin (ZOCOR) 10 MG tablet  11/18/18   [provider]      Allergies    Patient has no known allergies.    Review of Systems   Review of Systems  Constitutional:  Negative for fever.  HENT:  Negative for sore throat.   Eyes:  Negative for visual disturbance.  Respiratory:  Negative for shortness of breath.   Cardiovascular:  Negative for chest pain.  Gastrointestinal:  Negative for abdominal pain.  Genitourinary:  Negative for dysuria.  Skin:  Negative for rash.  Neurological:  Positive for speech difficulty, weakness and headaches. Negative for numbness.    Physical Exam Updated Vital Signs BP 121/76 (BP Location: Left Arm)   Pulse 83   Temp 100 F (37.8 C) (Oral)   Resp 16   SpO2 92%  Physical Exam Vitals and nursing note reviewed.  Constitutional:      General: He is not in acute distress.    Appearance: Normal appearance. He is well-developed.  HENT:     Head: Normocephalic and atraumatic.  Eyes:     Conjunctiva/sclera: Conjunctivae normal.  Cardiovascular:     Rate and Rhythm: Normal rate and regular rhythm.     Heart sounds: No murmur  heard. Pulmonary:     Effort: Pulmonary effort is normal. No respiratory distress.     Breath sounds: Normal breath sounds.  Abdominal:     Palpations: Abdomen is soft.     Tenderness: There is no abdominal tenderness.  Musculoskeletal:        General: No swelling.     Cervical back: Neck supple.  Skin:    General: Skin is warm and dry.     Capillary Refill: Capillary refill takes less than 2 seconds.  Neurological:     General: No focal deficit present.     Mental Status: He is alert and oriented to person, place, and time.     Cranial Nerves: No cranial nerve deficit.     Sensory: No sensory deficit.     Motor: Weakness present.     Comments: His upper extremity  strength seems 5 out of 5 although he feels subjectively slightly weaker on the left side.  Lower extremity strength is 4+ out of 5 on the left, 5 out of 5 on right.  I do not appreciate any definite sensory deficit.  There is no obvious facial asymmetry no slurred speech.     ED Results / Procedures / Treatments   Labs (all labs ordered are listed, but only abnormal results are displayed) Labs Reviewed  CBC - Abnormal; Notable for the following components:      Result Value   WBC 13.7 (*)    All other components within normal limits  DIFFERENTIAL - Abnormal; Notable for the following components:   Neutro Abs 10.7 (*)    Abs Immature Granulocytes 0.11 (*)    All other components within normal limits  COMPREHENSIVE METABOLIC PANEL - Abnormal; Notable for the following components:   Glucose, Bld 173 (*)    BUN 31 (*)    Creatinine, Ser 2.05 (*)    GFR, Estimated 34 (*)    All other components within normal limits  CBC - Abnormal; Notable for the following components:   WBC 12.3 (*)    All other components within normal limits  BASIC METABOLIC PANEL - Abnormal; Notable for the following components:   Glucose, Bld 164 (*)    BUN 29 (*)    Creatinine, Ser 1.30 (*)    GFR, Estimated 59 (*)    Anion gap 16 (*)    All other components within normal limits  BLOOD GAS, VENOUS - Abnormal; Notable for the following components:   pO2, Ven 50 (*)    All other components within normal limits  GLUCOSE, CAPILLARY - Abnormal; Notable for the following components:   Glucose-Capillary 173 (*)    All other components within normal limits  CBG MONITORING, ED - Abnormal; Notable for the following components:   Glucose-Capillary 166 (*)    All other components within normal limits  ETHANOL  PROTIME-INR  APTT  LIPID PANEL  RAPID URINE DRUG SCREEN, HOSP PERFORMED  URINALYSIS, ROUTINE W REFLEX MICROSCOPIC  HEMOGLOBIN A1C    EKG EKG Interpretation Date/Time:  Thursday October 24 2022 20:19:11  EDT Ventricular Rate:  108 PR Interval:  162 QRS Duration:  90 QT Interval:  336 QTC Calculation: 450 R Axis:   143  Text Interpretation: Sinus tachycardia Left posterior fascicular block Inferior infarct , age undetermined Anterior infarct , age undetermined Abnormal ECG When compared with ECG of 30-Jul-2017 00:04, Vent. rate has increased BY  41 BPM Left posterior fascicular block is now Present Anterior infarct is now Present Inferior infarct  is now Present Confirmed by Meridee Score [phone]) on 10/24/2022 8:26:19 PM  Radiology MR BRAIN WO CONTRAST  Result Date: 10/25/2022 CLINICAL DATA:  Initial evaluation for neuro deficit, stroke suspected. EXAM: MRI HEAD WITHOUT CONTRAST MRA HEAD WITHOUT CONTRAST TECHNIQUE: Multiplanar, multi-echo pulse sequences of the brain and surrounding structures were acquired without intravenous contrast. Angiographic images of the Circle of Willis were acquired using MRA technique without intravenous contrast. COMPARISON:  Prior study from 10/24/2022. FINDINGS: MRI HEAD FINDINGS Brain: Cerebral volume within normal limits. No focal parenchymal signal abnormality or significant cerebral white matter disease. No evidence for acute or subacute ischemia. Gray-white matter differentiation maintained. No areas of chronic cortical infarction. No acute or chronic intracranial blood products. No mass lesion, midline shift or mass effect. No hydrocephalus or extra-axial fluid collection. Pituitary gland suprasellar region within normal limits. Vascular: Major intracranial vascular flow voids are maintained. Skull and upper cervical spine: Craniocervical junction within normal limits. Bone marrow signal intensity normal. No scalp soft tissue abnormality. Sinuses/Orbits: Globes and orbital soft tissues within normal limits. Mild left frontoethmoidal scattered mucosal thickening noted about the paranasal sinuses, most notably at the left frontal ethmoidal region and right maxillary  sinus. No air-fluid levels. No significant mastoid effusion. Other: None. MRA HEAD FINDINGS Anterior circulation: Both internal carotid arteries are widely patent to the termini without stenosis or other abnormality. A1 segments patent bilaterally. Normal anterior communicating complex. Anterior cerebral arteries patent without stenosis. No M1 stenosis or occlusion. No proximal MCA branch occlusion or high-grade stenosis. Distal MCA branches perfused and symmetric. Posterior circulation: Both vertebral arteries are widely patent without stenosis. Left vertebral artery dominant. Left PICA patent. Right PICA not well seen. Basilar mildly tortuous but is widely patent without stenosis. Superior cerebral arteries patent bilaterally. Fetal type origin of the left PCA. Right PCA supplied via a hypoplastic right P1 segment and robust right posterior communicating artery. Both PCAs patent to their distal aspects without stenosis. Anatomic variants: As above.  No aneurysm. IMPRESSION: MRI HEAD IMPRESSION: Normal brain MRI. No acute intracranial abnormality identified. MRA HEAD IMPRESSION: Normal intracranial MRA. Electronically Signed   By: Rise Mu M.D.   On: 10/25/2022 03:48   MR ANGIO HEAD WO CONTRAST  Result Date: 10/25/2022 CLINICAL DATA:  Initial evaluation for neuro deficit, stroke suspected. EXAM: MRI HEAD WITHOUT CONTRAST MRA HEAD WITHOUT CONTRAST TECHNIQUE: Multiplanar, multi-echo pulse sequences of the brain and surrounding structures were acquired without intravenous contrast. Angiographic images of the Circle of Willis were acquired using MRA technique without intravenous contrast. COMPARISON:  Prior study from 10/24/2022. FINDINGS: MRI HEAD FINDINGS Brain: Cerebral volume within normal limits. No focal parenchymal signal abnormality or significant cerebral white matter disease. No evidence for acute or subacute ischemia. Gray-white matter differentiation maintained. No areas of chronic cortical  infarction. No acute or chronic intracranial blood products. No mass lesion, midline shift or mass effect. No hydrocephalus or extra-axial fluid collection. Pituitary gland suprasellar region within normal limits. Vascular: Major intracranial vascular flow voids are maintained. Skull and upper cervical spine: Craniocervical junction within normal limits. Bone marrow signal intensity normal. No scalp soft tissue abnormality. Sinuses/Orbits: Globes and orbital soft tissues within normal limits. Mild left frontoethmoidal scattered mucosal thickening noted about the paranasal sinuses, most notably at the left frontal ethmoidal region and right maxillary sinus. No air-fluid levels. No significant mastoid effusion. Other: None. MRA HEAD FINDINGS Anterior circulation: Both internal carotid arteries are widely patent to the termini without stenosis or other abnormality. A1 segments patent bilaterally.  Normal anterior communicating complex. Anterior cerebral arteries patent without stenosis. No M1 stenosis or occlusion. No proximal MCA branch occlusion or high-grade stenosis. Distal MCA branches perfused and symmetric. Posterior circulation: Both vertebral arteries are widely patent without stenosis. Left vertebral artery dominant. Left PICA patent. Right PICA not well seen. Basilar mildly tortuous but is widely patent without stenosis. Superior cerebral arteries patent bilaterally. Fetal type origin of the left PCA. Right PCA supplied via a hypoplastic right P1 segment and robust right posterior communicating artery. Both PCAs patent to their distal aspects without stenosis. Anatomic variants: As above.  No aneurysm. IMPRESSION: MRI HEAD IMPRESSION: Normal brain MRI. No acute intracranial abnormality identified. MRA HEAD IMPRESSION: Normal intracranial MRA. Electronically Signed   By: Rise Mu M.D.   On: 10/25/2022 03:48   CT HEAD CODE STROKE WO CONTRAST  Result Date: 10/24/2022 CLINICAL DATA:  Code stroke.  Initial evaluation for neuro deficit, stroke, left-sided deficits. EXAM: CT HEAD WITHOUT CONTRAST TECHNIQUE: Contiguous axial images were obtained from the base of the skull through the vertex without intravenous contrast. RADIATION DOSE REDUCTION: This exam was performed according to the departmental dose-optimization program which includes automated exposure control, adjustment of the mA and/or kV according to patient size and/or use of iterative reconstruction technique. COMPARISON:  Prior MRI from 08/18/2016. FINDINGS: Brain: Mild age-related cerebral atrophy. No acute intracranial hemorrhage. No acute large vessel territory infarct. No mass lesion or midline shift. No hydrocephalus or extra-axial fluid collection. Vascular: No abnormal hyperdense vessel. Skull: Scalp soft tissues and calvarium demonstrate no acute finding. Sinuses/Orbits: Globes and orbital soft tissues within normal limits. Visualized paranasal sinuses and mastoid air cells are clear. Other: 9. ASPECTS ([place]berta Stroke Program Early CT Score) - Ganglionic level infarction (caudate, lentiform nuclei, internal capsule, insula, M1-M3 cortex): 7 - Supraganglionic infarction (M4-M6 cortex): 3 Total score (0-10 with 10 being normal): 10 IMPRESSION: 1. No acute intracranial abnormality. 2. ASPECTS is 10. 3. Mild age-related cerebral atrophy. Results were called by telephone at the time of interpretation on 10/24/2022 at 8:44 pm to provider [facility] , who verbally acknowledged these results. Electronically Signed   By: Rise Mu M.D.   On: 10/24/2022 20:45    Procedures .Critical Care  Performed by: Terrilee Files, MD Authorized by: Terrilee Files, MD   Critical care provider statement:    Critical care time (minutes):  45   Critical care time was exclusive of:  Separately billable procedures and treating other patients   Critical care was necessary to treat or prevent imminent or life-threatening deterioration of the  following conditions:  CNS failure or compromise   Critical care was time spent personally by me on the following activities:  Development of treatment plan with patient or surrogate, discussions with consultants, evaluation of patient's response to treatment, examination of patient, obtaining history from patient or surrogate, ordering and performing treatments and interventions, ordering and review of laboratory studies, ordering and review of radiographic studies, pulse oximetry, re-evaluation of patient's condition and review of old charts   I assumed direction of critical care for this patient from another provider in my specialty: no       Medications Ordered in ED Medications  enoxaparin (LOVENOX) injection 40 mg (40 mg Subcutaneous Given 10/25/22 0526)  aspirin EC tablet 81 mg (81 mg Oral Given 10/25/22 0527)  clopidogrel (PLAVIX) tablet 75 mg (has no administration in time range)  atorvastatin (LIPITOR) tablet 80 mg (80 mg Oral Given 10/25/22 0528)  0.9 %  sodium chloride infusion ( Intravenous New Bag/Given 10/25/22 0525)  labetalol (NORMODYNE) injection 5 mg (has no administration in time range)  insulin aspart (novoLOG) injection 0-9 Units (2 Units Subcutaneous Given 10/25/22 0701)  acetaminophen (TYLENOL) tablet 650 mg (has no administration in time range)  prochlorperazine (COMPAZINE) injection 5 mg (has no administration in time range)  polyethylene glycol (MIRALAX / GLYCOLAX) packet 17 g (has no administration in time range)  sodium chloride 0.9 % bolus 500 mL (0 mLs Intravenous Stopped 10/24/22 2309)  clopidogrel (PLAVIX) tablet 300 mg (300 mg Oral Given 10/25/22 0527)    ED Course/ Medical Decision Making/ A&P Clinical Course as of 10/25/22 0908  Thu Oct 24, 2022  2054 On further questioning of the patient it seems like there may be has been some symptoms earlier today possibly as early as 35 AM.  He is also very drowsy here he says he takes chronic narcotics for chronic pain.   Neurology and I had a long discussion regarding TNK and neither of [place] feel comfortable that we have a solid time point within the window that it would be appropriate.  He is recommending admission to hospital for further neurologic workup, addition of Plavix. [MB]    Clinical Course User Index [MB] Terrilee Files, MD                             Medical Decision Making Amount and/or Complexity of Data Reviewed Labs: ordered. Radiology: ordered.  Risk Decision regarding hospitalization.   This patient complains of left-sided weakness; this involves an extensive number of treatment Options and is a complaint that carries with it a high risk of complications and morbidity. The differential includes stroke, bleed, seizure, neuropathy, hypoglycemia  I ordered, reviewed and interpreted labs, which included CBC with elevated white count stable hemoglobin, chemistries with acute AKI, hyperglycemia, coags normal, alcohol negative I ordered medication IV fluids and reviewed PMP when indicated. I ordered imaging studies which included CT head and I independently    visualized and interpreted imaging which showed no acute findings Previous records obtained and reviewed in epic, no recent visits here I consulted teleneurology and discussed lab and imaging findings and discussed disposition.  Cardiac monitoring reviewed, normal sinus rhythm Social determinants considered, ongoing tobacco use Critical Interventions: Stroke activation requiring bedside presents and extended evaluation and reassessments, consideration for TNK  After the interventions stated above, I reevaluated the patient and found patient's deficits seem to be waxing and waning and his symptom onset time is in question.  He also seems very drowsy, had just taken his chronic morphine prior to arrival.  Do not feel he can fully comply with exam or understand complex decision making Admission and further testing considered, he would  benefit from mission to the hospital for further workup of his neurologic deficits.  In agreement with neurology that TNK not indicated at this time.         Final Clinical Impression(s) / ED Diagnoses Final diagnoses:  Left-sided weakness  AKI (acute kidney injury) (HCC)  Somnolence    Rx / DC Orders ED Discharge Orders     None         Terrilee Files, MD 10/25/22 [phone]

## 2022-10-24 NOTE — ED Notes (Signed)
Report called to [facility] RN at [facility]. [facility] at bedside

## 2022-10-24 NOTE — ED Triage Notes (Signed)
Pt states he thinks he's had a small stroke. States that his L leg "doesn't work right, he's been dropping things from L hand, speech slurred." States it started just before 1830, neighbor states that he fell in the shower "right around when stroke happened."  States he had "slight HA, wife gave me 2 ASA."

## 2022-10-24 NOTE — Progress Notes (Signed)
Plan of Care Note for accepted transfer   Patient: Kirk Brown MRN: [id number]   DOA: 10/24/2022  Facility requesting transfer: [facility]   Requesting Provider: Dr. Charm Barges   Reason for transfer: Left-sided weakness   Facility course: 72 yr old man with HTN, HLD, DM, anxiety, and chronic pain who presents with left sided weakness and slurred speech.   There is no acute finding on head CT. Creatinine is 2.05 (0.76 in 2019).   Teleneuro recommended Plavix load, DAPT, and admission for CVA workup.   Plan of care: The patient is accepted for admission to Telemetry unit, at [facility].   Author: Briscoe Deutscher, MD 10/24/2022  Check [email or web address] for on-call coverage.  Nursing staff, Please call [facility] Admits & Consults System-Wide number on Amion as soon as patient's arrival, so appropriate admitting provider can evaluate the pt.

## 2022-10-25 ENCOUNTER — Observation Stay (HOSPITAL_COMMUNITY): Payer: PPO

## 2022-10-25 DIAGNOSIS — M109 Gout, unspecified: Secondary | ICD-10-CM | POA: Diagnosis not present

## 2022-10-25 DIAGNOSIS — F17210 Nicotine dependence, cigarettes, uncomplicated: Secondary | ICD-10-CM | POA: Diagnosis not present

## 2022-10-25 DIAGNOSIS — I340 Nonrheumatic mitral (valve) insufficiency: Secondary | ICD-10-CM

## 2022-10-25 DIAGNOSIS — M545 Low back pain, unspecified: Secondary | ICD-10-CM | POA: Diagnosis not present

## 2022-10-25 DIAGNOSIS — Z91148 Patient's other noncompliance with medication regimen for other reason: Secondary | ICD-10-CM | POA: Diagnosis not present

## 2022-10-25 DIAGNOSIS — E860 Dehydration: Secondary | ICD-10-CM | POA: Diagnosis not present

## 2022-10-25 DIAGNOSIS — R531 Weakness: Secondary | ICD-10-CM | POA: Diagnosis not present

## 2022-10-25 DIAGNOSIS — E1140 Type 2 diabetes mellitus with diabetic neuropathy, unspecified: Secondary | ICD-10-CM | POA: Diagnosis not present

## 2022-10-25 DIAGNOSIS — D72829 Elevated white blood cell count, unspecified: Secondary | ICD-10-CM | POA: Diagnosis not present

## 2022-10-25 DIAGNOSIS — E785 Hyperlipidemia, unspecified: Secondary | ICD-10-CM | POA: Diagnosis not present

## 2022-10-25 DIAGNOSIS — Z79891 Long term (current) use of opiate analgesic: Secondary | ICD-10-CM | POA: Diagnosis not present

## 2022-10-25 DIAGNOSIS — N179 Acute kidney failure, unspecified: Secondary | ICD-10-CM | POA: Diagnosis not present

## 2022-10-25 DIAGNOSIS — Z79899 Other long term (current) drug therapy: Secondary | ICD-10-CM | POA: Diagnosis not present

## 2022-10-25 DIAGNOSIS — G2581 Restless legs syndrome: Secondary | ICD-10-CM | POA: Diagnosis not present

## 2022-10-25 DIAGNOSIS — R400 Somnolence: Secondary | ICD-10-CM | POA: Diagnosis not present

## 2022-10-25 DIAGNOSIS — I10 Essential (primary) hypertension: Secondary | ICD-10-CM | POA: Diagnosis not present

## 2022-10-25 DIAGNOSIS — F411 Generalized anxiety disorder: Secondary | ICD-10-CM | POA: Diagnosis not present

## 2022-10-25 DIAGNOSIS — Z83438 Family history of other disorder of lipoprotein metabolism and other lipidemia: Secondary | ICD-10-CM | POA: Diagnosis not present

## 2022-10-25 DIAGNOSIS — G9341 Metabolic encephalopathy: Secondary | ICD-10-CM | POA: Diagnosis not present

## 2022-10-25 DIAGNOSIS — M79652 Pain in left thigh: Secondary | ICD-10-CM | POA: Diagnosis not present

## 2022-10-25 DIAGNOSIS — G4710 Hypersomnia, unspecified: Secondary | ICD-10-CM | POA: Diagnosis not present

## 2022-10-25 DIAGNOSIS — G4731 Primary central sleep apnea: Secondary | ICD-10-CM | POA: Diagnosis not present

## 2022-10-25 DIAGNOSIS — M1990 Unspecified osteoarthritis, unspecified site: Secondary | ICD-10-CM | POA: Diagnosis not present

## 2022-10-25 DIAGNOSIS — R29818 Other symptoms and signs involving the nervous system: Secondary | ICD-10-CM | POA: Diagnosis not present

## 2022-10-25 DIAGNOSIS — E1165 Type 2 diabetes mellitus with hyperglycemia: Secondary | ICD-10-CM | POA: Diagnosis not present

## 2022-10-25 DIAGNOSIS — Z7984 Long term (current) use of oral hypoglycemic drugs: Secondary | ICD-10-CM | POA: Diagnosis not present

## 2022-10-25 DIAGNOSIS — G894 Chronic pain syndrome: Secondary | ICD-10-CM | POA: Diagnosis not present

## 2022-10-25 LAB — RAPID URINE DRUG SCREEN, HOSP PERFORMED
Amphetamines: NOT DETECTED
Barbiturates: NOT DETECTED
Benzodiazepines: POSITIVE — AB
Cocaine: NOT DETECTED
Opiates: POSITIVE — AB
Tetrahydrocannabinol: NOT DETECTED

## 2022-10-25 LAB — CBC
HCT: 39.1 % (ref 39.0–52.0)
Hemoglobin: 13.1 g/dL (ref 13.0–17.0)
MCH: 30.8 pg (ref 26.0–34.0)
MCHC: 33.5 g/dL (ref 30.0–36.0)
MCV: 91.8 fL (ref 80.0–100.0)
Platelets: 178 10*3/uL (ref 150–400)
RBC: 4.26 MIL/uL (ref 4.22–5.81)
RDW: 13.5 % (ref 11.5–15.5)
WBC: 12.3 10*3/uL — ABNORMAL HIGH (ref 4.0–10.5)
nRBC: 0 % (ref 0.0–0.2)

## 2022-10-25 LAB — BASIC METABOLIC PANEL
Anion gap: 16 — ABNORMAL HIGH (ref 5–15)
BUN: 29 mg/dL — ABNORMAL HIGH (ref 8–23)
CO2: 24 mmol/L (ref 22–32)
Calcium: 8.9 mg/dL (ref 8.9–10.3)
Chloride: 100 mmol/L (ref 98–111)
Creatinine, Ser: 1.3 mg/dL — ABNORMAL HIGH (ref 0.61–1.24)
GFR, Estimated: 59 mL/min — ABNORMAL LOW (ref >60)
Glucose, Bld: 164 mg/dL — ABNORMAL HIGH (ref 70–99)
Potassium: 4.1 mmol/L (ref 3.5–5.1)
Sodium: 140 mmol/L (ref 135–145)

## 2022-10-25 LAB — GLUCOSE, CAPILLARY
Glucose-Capillary: 173 mg/dL — ABNORMAL HIGH (ref 70–99)
Glucose-Capillary: 169 mg/dL — ABNORMAL HIGH (ref 70–99)
Glucose-Capillary: 198 mg/dL — ABNORMAL HIGH (ref 70–99)
Glucose-Capillary: 115 mg/dL — ABNORMAL HIGH (ref 70–99)
Glucose-Capillary: 202 mg/dL — ABNORMAL HIGH (ref 70–99)

## 2022-10-25 LAB — BLOOD GAS, VENOUS
Acid-base deficit: 1.4 mmol/L (ref 0.0–2.0)
Bicarbonate: 26.6 mmol/L (ref 20.0–28.0)
O2 Saturation: 82.6 %
Patient temperature: 37.8
pCO2, Ven: 60 mmHg (ref 44–60)
pH, Ven: 7.26 (ref 7.25–7.43)
pO2, Ven: 50 mmHg — ABNORMAL HIGH (ref 32–45)

## 2022-10-25 LAB — LIPID PANEL
Cholesterol: 159 mg/dL (ref 0–200)
HDL: 48 mg/dL (ref >40)
LDL Cholesterol: 92 mg/dL (ref 0–99)
Total CHOL/HDL Ratio: 3.3 RATIO
Triglycerides: 97 mg/dL (ref <150)
VLDL: 19 mg/dL (ref 0–40)

## 2022-10-25 LAB — ECHOCARDIOGRAM COMPLETE BUBBLE STUDY: Area-P 1/2: 2.43 cm2

## 2022-10-25 MED ORDER — INSULIN ASPART 100 UNIT/ML IJ SOLN
0.0000 [IU] | INTRAMUSCULAR | Status: DC
  Administered 2022-10-25 (×2): 2 [IU] via SUBCUTANEOUS

## 2022-10-25 MED ORDER — POLYETHYLENE GLYCOL 3350 17 G PO PACK: 17.0000 g | PACK | Freq: Every day | ORAL | Status: DC | PRN

## 2022-10-25 MED ORDER — SODIUM CHLORIDE 0.9 % IV SOLN: INTRAVENOUS | Status: DC

## 2022-10-25 MED ORDER — PROCHLORPERAZINE EDISYLATE 10 MG/2ML IJ SOLN: 5.0000 mg | Freq: Four times a day (QID) | INTRAMUSCULAR | Status: DC | PRN

## 2022-10-25 MED ORDER — ATORVASTATIN CALCIUM 80 MG PO TABS
80.0000 mg | ORAL_TABLET | Freq: Every day | ORAL | Status: DC
  Administered 2022-10-25: 80 mg via ORAL
  Filled 2022-10-25: qty 1

## 2022-10-25 MED ORDER — INSULIN ASPART 100 UNIT/ML IJ SOLN
0.0000 [IU] | Freq: Three times a day (TID) | INTRAMUSCULAR | Status: DC
  Administered 2022-10-26: 2 [IU] via SUBCUTANEOUS
  Administered 2022-10-26: 1 [IU] via SUBCUTANEOUS

## 2022-10-25 MED ORDER — MORPHINE SULFATE ER 30 MG PO TBCR
30.0000 mg | EXTENDED_RELEASE_TABLET | Freq: Two times a day (BID) | ORAL | Status: DC
  Administered 2022-10-25 – 2022-10-26 (×2): 30 mg via ORAL
  Filled 2022-10-25 (×2): qty 1

## 2022-10-25 MED ORDER — CLOPIDOGREL BISULFATE 75 MG PO TABS: 75.0000 mg | ORAL_TABLET | Freq: Every day | ORAL | Status: DC

## 2022-10-25 MED ORDER — CLONAZEPAM 0.5 MG PO TABS
0.5000 mg | ORAL_TABLET | Freq: Two times a day (BID) | ORAL | Status: DC | PRN
  Administered 2022-10-25: 0.5 mg via ORAL
  Filled 2022-10-25: qty 1

## 2022-10-25 MED ORDER — CLOPIDOGREL BISULFATE 75 MG PO TABS
300.0000 mg | ORAL_TABLET | Freq: Once | ORAL | Status: AC
  Administered 2022-10-25: 300 mg via ORAL
  Filled 2022-10-25: qty 4

## 2022-10-25 MED ORDER — ASPIRIN 81 MG PO TBEC
81.0000 mg | DELAYED_RELEASE_TABLET | Freq: Every day | ORAL | Status: DC
  Administered 2022-10-25 – 2022-10-26 (×2): 81 mg via ORAL
  Filled 2022-10-25 (×2): qty 1

## 2022-10-25 MED ORDER — INSULIN ASPART 100 UNIT/ML IJ SOLN
0.0000 [IU] | Freq: Every day | INTRAMUSCULAR | Status: DC
  Administered 2022-10-25: 2 [IU] via SUBCUTANEOUS

## 2022-10-25 MED ORDER — MORPHINE SULFATE 15 MG PO TABS: 15.0000 mg | ORAL_TABLET | ORAL | Status: DC | PRN

## 2022-10-25 MED ORDER — ATORVASTATIN CALCIUM 40 MG PO TABS
40.0000 mg | ORAL_TABLET | Freq: Every day | ORAL | Status: DC
  Administered 2022-10-26: 40 mg via ORAL
  Filled 2022-10-25: qty 1

## 2022-10-25 MED ORDER — LABETALOL HCL 5 MG/ML IV SOLN: 5.0000 mg | INTRAVENOUS | Status: DC | PRN

## 2022-10-25 MED ORDER — ENOXAPARIN SODIUM 40 MG/0.4ML IJ SOSY
40.0000 mg | PREFILLED_SYRINGE | INTRAMUSCULAR | Status: DC
  Administered 2022-10-25 – 2022-10-26 (×2): 40 mg via SUBCUTANEOUS
  Filled 2022-10-25 (×2): qty 0.4

## 2022-10-25 MED ORDER — ACETAMINOPHEN 325 MG PO TABS
650.0000 mg | ORAL_TABLET | Freq: Four times a day (QID) | ORAL | Status: DC | PRN
  Administered 2022-10-25: 650 mg via ORAL
  Filled 2022-10-25: qty 2

## 2022-10-25 NOTE — Progress Notes (Signed)
[facility] PLAN OF CARE NOTE Patient: Kirk Brown ZOX:[id number]   PCP: Irena Reichmann, DO DOB: [birth year]   DOA: 10/24/2022   DOS: 10/25/2022    Patient was admitted by my colleague earlier on 10/25/2022. I have reviewed the H&P as well as assessment and plan and agree with the same. Important changes in the plan are listed below.  Plan of care: Principal Problem:   Left-sided weakness Active Problems:   AKI (acute kidney injury) (HCC) Primary presentation appears to be secondary to AKI. Will be giving IV fluid and monitor. Discussed with neurology for  Level of care: Telemetry Medical  Author: Lynden Oxford, MD  [facility] 10/25/2022 6:51 PM   If 7PM-7AM, please contact night-coverage at [email or web address]

## 2022-10-25 NOTE — Evaluation (Signed)
Clinical/Bedside Swallow Evaluation Patient Details  Name: Kirk Brown MRN: [id number] Date of Birth: 18-Mar-1951  Today's Date: 10/25/2022 Time: SLP Start Time (ACUTE ONLY): 0949 SLP Stop Time (ACUTE ONLY): 1002 SLP Time Calculation (min) (ACUTE ONLY): 13 min  Past Medical History:  Past Medical History:  Diagnosis Date   Arthritis    Chronic back pain    Diabetes mellitus without complication (HCC)    Gout    Hyperlipemia    Hypertension    RLS (restless legs syndrome)    Wears glasses    Past Surgical History:  Past Surgical History:  Procedure Laterality Date   ANKLE ARTHROSCOPY WITH ARTHRODESIS  03/12/2012   Procedure: ANKLE ARTHROSCOPY WITH ARTHRODESIS;  Surgeon: Toni Arthurs, MD;  Location: [facility];  Service: Orthopedics;  Laterality: Right;  Right Ankle Arthroscopy with Arthrodesis and  Bone Marrow Aspiration Right Hip (BMAC)   CARPAL TUNNEL RELEASE     both hands   COLONOSCOPY     ELBOW ARTHROSCOPY     both   INSERTION OF MESH N/A 11/02/2012   Procedure: INSERTION OF MESH;  Surgeon: Lodema Pilot, DO;  Location: [facility];  Service: General;  Laterality: N/A;   LAPAROSCOPY N/A 11/02/2012   Procedure: LAPAROSCOPY DIAGNOSTIC  bilateral inguinal hernia ;  Surgeon: Lodema Pilot, DO;  Location: [facility];  Service: General;  Laterality: N/A;   right fracture      right leg compound fracture-age 31   SHOULDER ARTHROSCOPY     lt   TONSILLECTOMY     UMBILICAL HERNIA REPAIR N/A 11/02/2012   Procedure: HERNIA REPAIR UMBILICAL ADULT;  Surgeon: Lodema Pilot, DO;  Location: [facility];  Service: General;  Laterality: N/A;   HPI:  Pt is a 72 y.o. male who presented 10/24/22 with L-sided weakness and gait difficulty. Normal brain MRI. Normal intracranial MRA. PMH: DM2, HLD, HTN, gout, former smoker, sleep apnea not on CPAP, chronic back pain on chronic opioids, anxiety, restless leg syndrome    Assessment / Plan / Recommendation  Clinical Impression  Pt presents with grossly  normal swallow function as assessed clinically. OME was unremarkable. Pt tolerated all consistencies trialed inculding serial straw sips of thin liquid with no clinical s/s of aspiration.  Pt exhibited good oral clearance of solids.  Pt's speech is clear and without dysarthria and he endorses that it is at baseline.  Pt has no further ST needs.  SLP will sign off.   Recommend regular texture diet with thin liquids.  SLP Visit Diagnosis: Dysphagia, unspecified (R13.10)    Aspiration Risk  No limitations    Diet Recommendation Regular;Thin liquid    Liquid Administration via: Straw;Cup Medication Administration: Whole meds with liquid Supervision: Patient able to self feed Compensations: Slow rate;Small sips/bites Postural Changes: Seated upright at 90 degrees    Other  Recommendations Oral Care Recommendations: Oral care BID    Recommendations for follow up therapy are one component of a multi-disciplinary discharge planning process, led by the attending physician.  Recommendations may be updated based on patient status, additional functional criteria and insurance authorization.  Follow up Recommendations No SLP follow up      Assistance Recommended at Discharge  N/A  Functional Status Assessment Patient has not had a recent decline in their functional status  Frequency and Duration  (N/A)          Prognosis Prognosis for improved oropharyngeal function:  (N/A)      Swallow Study   General Date of Onset: 10/24/22  HPI: Pt is a 72 y.o. male who presented 10/24/22 with L-sided weakness and gait difficulty. Normal brain MRI. Normal intracranial MRA. PMH: DM2, HLD, HTN, gout, former smoker, sleep apnea not on CPAP, chronic back pain on chronic opioids, anxiety, restless leg syndrome Type of Study: Bedside Swallow Evaluation Previous Swallow Assessment: None Diet Prior to this Study: NPO Temperature Spikes Noted: No History of Recent Intubation: No Behavior/Cognition:  Alert;Cooperative;Pleasant mood Oral Cavity Assessment: Within Functional Limits Oral Care Completed by SLP: No Oral Cavity - Dentition: Adequate natural dentition Vision: Functional for self-feeding Self-Feeding Abilities: Able to feed self Patient Positioning: Upright in chair Baseline Vocal Quality: Normal Volitional Cough: Strong Volitional Swallow: Able to elicit    Oral/Motor/Sensory Function Overall Oral Motor/Sensory Function: Within functional limits   Ice Chips Ice chips: Not tested   Thin Liquid Thin Liquid: Within functional limits Presentation: Straw    Nectar Thick Nectar Thick Liquid: Not tested   Honey Thick Honey Thick Liquid: Not tested   Puree Puree: Within functional limits Presentation: Self Fed   Solid     Solid: Within functional limits Presentation: Self Fed      Kerrie Pleasure, MA, CCC-SLP Acute Rehabilitation Services Office: [phone] 10/25/2022,10:10 AM

## 2022-10-25 NOTE — Progress Notes (Signed)
   10/25/22 2201  BiPAP/CPAP/SIPAP  Reason BIPAP/CPAP not in use Non-compliant   Pt Refusing CPAP at this time.  RT explained that we can make a unit available to him if he changes his mind, to which he was agreeable.  RT will continue to monitor

## 2022-10-25 NOTE — H&P (Addendum)
History and Physical  Kirk Brown ZOX:[id number] DOB: [birth year] DOA: 10/24/2022  Referring physician: Accepted by Dr. Sherrell Puller, hospitalist service. PCP: Irena Reichmann, DO  Outpatient Specialists: ENT.  Sleep medicine, neurology. Patient coming from: Home via [facility].  Chief Complaint: Left-sided heaviness and difficulty walking.  HPI: Kirk Brown is a 72 y.o. male with medical history significant for type 2 diabetes, hyperlipidemia, hypertension, restless leg syndrome, gout, former smoker, central sleep apnea not on CPAP prior to admission, chronic back pain on chronic opioids, chronic generalized anxiety on chronic benzodiazepine, who initially presented to [facility] ED from home as a code stroke.  Reports left-sided weakness.  Has been dropping things from his left hand.  Also endorses left leg weakness, causing him to fall in the shower on the day of presentation.  Associated with slurred speech.  Reportedly symptoms started just before 1830.  He presented to the ED for further evaluation.  Code stroke was activated in the ED.  The patient was seen by neurology/stroke team.  Noncontrast head CT was nonacute.  Stroke team recommended admission for stroke workup.  Last known well is unclear.  The patient did not receive tPA, as he was outside the time window for thrombolytics.  EDP requested admission.  The patient was admitted by [facility], hospitalist service, by Dr. Antionette Char, and was transferred to [facility] telemetry medical unit as observation status.  At the time of this visit, the patient is hypersomnolent but easily arousable to voices.  Left lower extremity weakness is noted on exam.  Mild slurring of speech is also noted.  The patient was able to drink water at bedside with no signs of dysphagia for liquids.  No evidence of aspiration.  No coughing.  ED Course: BP 145/84, pulse 84, respiratory 11, O2 saturation 93% on room air.  Lab studies notable for BUN 31 creatinine  2.05, glucose 173, GFR 34.  WBC 13.7.  Neutrophil count 10.7.  Review of Systems: Review of systems as noted in the HPI. All other systems reviewed and are negative.   Past Medical History:  Diagnosis Date   Arthritis    Chronic back pain    Diabetes mellitus without complication (HCC)    Gout    Hyperlipemia    Hypertension    RLS (restless legs syndrome)    Wears glasses    Past Surgical History:  Procedure Laterality Date   ANKLE ARTHROSCOPY WITH ARTHRODESIS  03/12/2012   Procedure: ANKLE ARTHROSCOPY WITH ARTHRODESIS;  Surgeon: Toni Arthurs, MD;  Location: [facility];  Service: Orthopedics;  Laterality: Right;  Right Ankle Arthroscopy with Arthrodesis and  Bone Marrow Aspiration Right Hip (BMAC)   CARPAL TUNNEL RELEASE     both hands   COLONOSCOPY     ELBOW ARTHROSCOPY     both   INSERTION OF MESH N/A 11/02/2012   Procedure: INSERTION OF MESH;  Surgeon: Lodema Pilot, DO;  Location: [facility];  Service: General;  Laterality: N/A;   LAPAROSCOPY N/A 11/02/2012   Procedure: LAPAROSCOPY DIAGNOSTIC  bilateral inguinal hernia ;  Surgeon: Lodema Pilot, DO;  Location: [facility];  Service: General;  Laterality: N/A;   right fracture      right leg compound fracture-age 22   SHOULDER ARTHROSCOPY     lt   TONSILLECTOMY     UMBILICAL HERNIA REPAIR N/A 11/02/2012   Procedure: HERNIA REPAIR UMBILICAL ADULT;  Surgeon: Lodema Pilot, DO;  Location: [facility];  Service: General;  Laterality: N/A;  Social History:  reports that he has been smoking cigarettes. He has never used smokeless tobacco. He reports current alcohol use of about 3.0 standard drinks of alcohol per week. He reports that he does not use drugs.   No Known Allergies  Family History  Problem Relation Age of Onset   Kidney Stones Mother    Hyperlipidemia Mother    Diabetes Mother    Alzheimer's disease Mother    Kidney Stones Sister       Prior to Admission medications   Medication Sig Start Date End Date  Taking? Authorizing Provider  allopurinol (ZYLOPRIM) 300 MG tablet Take 300 mg by mouth daily as needed (gout flare up). Patient not taking: Reported on 06/21/2020    [provider]  clonazePAM (KLONOPIN) 0.5 MG tablet Take 0.5 mg by mouth daily. 1 tablet, BID and 2 tab at bedtime 12/29/18   [provider]  glimepiride (AMARYL) 1 MG tablet Take 0.5 mg by mouth at bedtime.     [provider]  metFORMIN (GLUCOPHAGE) 500 MG tablet 500 mg 3 (three) times daily. 500 mg in AM and 2 tab in evening    [provider]  Morphine Sulfate ER 30 MG TBEA Take by mouth.     [provider]  NARCAN 4 MG/0.1ML LIQD nasal spray kit  11/10/18   [provider]  simvastatin (ZOCOR) 10 MG tablet  11/18/18   [provider]    Physical Exam: BP (!) 135/101   Pulse (!) 107   Resp 14   SpO2 94%   General: 72 y.o. year-old male well developed well nourished in no acute distress.  Somnolent but arousable to voices. Cardiovascular: Regular rate and rhythm with no rubs or gallops.  No thyromegaly or JVD noted.  No lower extremity edema. 2/4 pulses in all 4 extremities. Respiratory: Clear to auscultation with no wheezes or rales. Poor inspiratory effort. Abdomen: Soft nontender nondistended with normal bowel sounds x4 quadrants. Muskuloskeletal: No cyanosis, clubbing or edema noted bilaterally Neuro: CN II-XII intact, sensation, reflexes.  4/5 strength in LLE.  5/5 strength in RLE. Skin: No ulcerative lesions noted or rashes Psychiatry: Unable to assess mood or judgment due to somnolence.           Labs on Admission:  Basic Metabolic Panel: Recent Labs  Lab 10/24/22 2035  NA 142  K 4.1  CL 102  CO2 25  GLUCOSE 173*  BUN 31*  CREATININE 2.05*  CALCIUM 9.7   Liver Function Tests: Recent Labs  Lab 10/24/22 2035  AST 24  ALT 20  ALKPHOS 39  BILITOT 0.5  PROT 8.0  ALBUMIN 4.8   No results for input(s): "LIPASE", "AMYLASE" in the last 168  hours. No results for input(s): "AMMONIA" in the last 168 hours. CBC: Recent Labs  Lab 10/24/22 2035  WBC 13.7*  NEUTROABS 10.7*  HGB 14.4  HCT 42.2  MCV 90.9  PLT 224   Cardiac Enzymes: No results for input(s): "CKTOTAL", "CKMB", "CKMBINDEX", "TROPONINI" in the last 168 hours.  BNP (last 3 results) No results for input(s): "BNP" in the last 8760 hours.  ProBNP (last 3 results) No results for input(s): "PROBNP" in the last 8760 hours.  CBG: Recent Labs  Lab 10/24/22 2017  GLUCAP 166*    Radiological Exams on Admission: CT HEAD CODE STROKE WO CONTRAST  Result Date: 10/24/2022 CLINICAL DATA:  Code stroke. Initial evaluation for neuro deficit, stroke, left-sided deficits. EXAM: CT HEAD WITHOUT CONTRAST TECHNIQUE: Contiguous  axial images were obtained from the base of the skull through the vertex without intravenous contrast. RADIATION DOSE REDUCTION: This exam was performed according to the departmental dose-optimization program which includes automated exposure control, adjustment of the mA and/or kV according to patient size and/or use of iterative reconstruction technique. COMPARISON:  Prior MRI from 08/18/2016. FINDINGS: Brain: Mild age-related cerebral atrophy. No acute intracranial hemorrhage. No acute large vessel territory infarct. No mass lesion or midline shift. No hydrocephalus or extra-axial fluid collection. Vascular: No abnormal hyperdense vessel. Skull: Scalp soft tissues and calvarium demonstrate no acute finding. Sinuses/Orbits: Globes and orbital soft tissues within normal limits. Visualized paranasal sinuses and mastoid air cells are clear. Other: 9. ASPECTS (Alberta Stroke Program Early CT Score) - Ganglionic level infarction (caudate, lentiform nuclei, internal capsule, insula, M1-M3 cortex): 7 - Supraganglionic infarction (M4-M6 cortex): 3 Total score (0-10 with 10 being normal): 10 IMPRESSION: 1. No acute intracranial abnormality. 2. ASPECTS is 10. 3. Mild  age-related cerebral atrophy. Results were called by telephone at the time of interpretation on 10/24/2022 at 8:44 pm to provider [facility] , who verbally acknowledged these results. Electronically Signed   By: Rise Mu M.D.   On: 10/24/2022 20:45    EKG: I independently viewed the EKG done and my findings are as followed: Sinus tachycardia rate of 108.  Nonspecific ST-T changes.  QTc 450.  Assessment/Plan Present on Admission: **None**  Principal Problem:   Left-sided weakness  Left-sided weakness, rule out acute stroke Noncontrast CT head was nonacute Follow MRI brain without contrast, MRA head without contrast, and bilateral carotid Doppler ultrasound Frequent neurochecks 2D echo with bubble study Fasting lipid panel, hemoglobin A1c PT/OT/speech therapist evaluation Plavix load 300 mg x 1 as recommended by neurology Start DAPT aspirin 81 mg daily and Plavix 75 mg daily x 21 days then antiplatelet monotherapy alone. High intensity statin, Lipitor 80 mg daily Permissive hypertension, treat SBP greater than 220 or DBP greater than 120. IV labetalol as needed with parameters. Rest of management per neurology/stroke team  AKI, suspect prerenal from dehydration Creatinine at baseline 0.7 with GFR greater than 60 from prior medical records. Presented with creatinine of 2.05 with GFR 34 Gentle IV fluid hydration, NS at 75 cc/h x 1 day. Avoid nephrotoxic agents, dehydration and hypotension Monitor urine output Repeat BMP in the morning.  Chronic pain syndrome on chronic opiates Hold off opiates for now due to hypersomnolence  Chronic generalized anxiety on chronic benzodiazepine Hold off sedatives for now due to hypersomnolence Resume home benzodiazepine at lowest dose when patient is more alert.  Central apnea, not on CPAP Hypersomnolence Obtain VBG to assess pH and pCO2  Leukocytosis, possibly reactive Nonseptic appearing and afebrile Follow UA  Lungs  auscultation were benign.  Type 2 diabetes with hyperglycemia Obtain hemoglobin A1c Goal hemoglobin A1c less than 7.0 percent Start insulin sliding scale every 4 hours while NPO Start heart healthy carb modified diet if able to pass a complete swallow evaluation at bedside.  Gout Resume home allopurinol  Hyperlipidemia Follow fasting lipid panel Goal LDL less than 70 Started high intensity statin, Lipitor 80 mg daily   Time: 75 minutes.    DVT prophylaxis: Subcu Lovenox daily  Code Status: Full code  Family Communication: None at bedside  Disposition Plan: Admitted to telemetry medical unit  Consults called: Neurology  Admission status: Observation status   Status is: Observation    Darlin Drop MD [facility] Pager [phone]  If 7PM-7AM, please contact night-coverage  [email or web address] Password [email or web address]  10/25/2022, 1:58 AM

## 2022-10-25 NOTE — Evaluation (Signed)
Occupational Therapy Evaluation Patient Details Name: Kirk Brown MRN: [id number] DOB: May 11, 1950 Today's Date: 10/25/2022   History of Present Illness Pt is a 72 y.o. male who presented 10/24/22 with L-sided weakness and gait difficulty. Normal brain MRI. Normal intracranial MRA. PMH: DM2, HLD, HTN, gout, former smoker, sleep apnea not on CPAP, chronic back pain on chronic opioids, anxiety, restless leg syndrome   Clinical Impression   PTA, pt lives with spouse, typically Independent with ADLs, IADLs, driving and mobility. Pt presents now with deficits in standing balance, endurance and cognition (questionable impact d/t initial lethargy). Pt requiring Min A x 2 for initial brief stand noted with unsteadiness, progressing to min guard with use of RW for bathroom mobility. Pt requires Setup for UB ADL and min guard for LB ADLs. Emphasized use of RW at home, close guarding for initial mobility. Orthostatics negative (see PT note for BP readings). Pending continued progress, recommend HHOT at DC to maximize safety in the home.      Recommendations for follow up therapy are one component of a multi-disciplinary discharge planning process, led by the attending physician.  Recommendations may be updated based on patient status, additional functional criteria and insurance authorization.   Assistance Recommended at Discharge Set up Supervision/Assistance  Patient can return home with the following A little help with bathing/dressing/bathroom;Assistance with cooking/housework;Direct supervision/assist for medications management;Direct supervision/assist for financial management;Help with stairs or ramp for entrance;Assist for transportation    Functional Status Assessment  Patient has had a recent decline in their functional status and demonstrates the ability to make significant improvements in function in a reasonable and predictable amount of time.  Equipment Recommendations  Other (comment) (RW)     Recommendations for Other Services       Precautions / Restrictions Precautions Precautions: Fall Precaution Comments: watch BP Restrictions Weight Bearing Restrictions: No      Mobility Bed Mobility Overal bed mobility: Needs Assistance Bed Mobility: Supine to Sit     Supine to sit: Supervision, HOB elevated     General bed mobility comments: Extra time and HOB elevated, supervision for safety    Transfers Overall transfer level: Needs assistance Equipment used: Rolling walker (2 wheels) Transfers: Sit to/from Stand Sit to Stand: Min assist, Min guard, +2 safety/equipment           General transfer comment: Extra time and effort to power up to stand, improving with each rep. MinA the first 2 reps from EOB, sitting briefly after standing the first rep as his legs became shaky. Min guard assist the 3rd rep from toilet.      Balance Overall balance assessment: Needs assistance Sitting-balance support: No upper extremity supported, Feet supported Sitting balance-Leahy Scale: Fair Sitting balance - Comments: Supervision initially but pt with bouts of posterior leaning once sitting after standing the first time with eyes noted to be closing or rolling back but pt still conversating with therapists, min A intermittently to regain balance Postural control: Posterior lean Standing balance support: Bilateral upper extremity supported, No upper extremity supported, Single extremity supported Standing balance-Leahy Scale: Fair Standing balance comment: Able to stand at sink to perform ADLs without UE support intermittently but tends to prefer UE support, benefits from RW to ambulate                           ADL either performed or assessed with clinical judgement   ADL Overall ADL's : Needs assistance/impaired Eating/Feeding: NPO Eating/Feeding Details (  indicate cue type and reason): though anticipate no issues Grooming: Min guard;Standing;Oral care;Wash/dry  face Grooming Details (indicate cue type and reason): did perseverate on washing face initially bed level then did again standing at sink Upper Body Bathing: Set up   Lower Body Bathing: Min guard   Upper Body Dressing : Set up   Lower Body Dressing: Min guard Lower Body Dressing Details (indicate cue type and reason): able to don socks EOB Toilet Transfer: Min guard;Minimal assistance;Ambulation;Rolling walker (2 wheels) Toilet Transfer Details (indicate cue type and reason): iniitally Min A with some unsteadiness progressing to min guard Toileting- Clothing Manipulation and Hygiene: Min guard;Sitting/lateral lean;Sit to/from stand       Functional mobility during ADLs: Min guard;Minimal assistance;Rolling walker (2 wheels) General ADL Comments: initial unsteadiness, questionable cognitive/lethargy impacting performance as well     Vision Baseline Vision/History: 1 Wears glasses Ability to See in Adequate Light: 0 Adequate Patient Visual Report: No change from baseline Vision Assessment?: No apparent visual deficits Additional Comments: question some depth perception issues but also could be due to initial lethargy     Perception     Praxis      Pertinent Vitals/Pain Pain Assessment Pain Assessment: Faces Faces Pain Scale: Hurts little more Pain Location: chronic back pain (more on R side of low back) Pain Descriptors / Indicators: Discomfort, Grimacing, Guarding Pain Intervention(s): Monitored during session     Hand Dominance Left   Extremity/Trunk Assessment Upper Extremity Assessment Upper Extremity Assessment: Overall WFL for tasks assessed (strength 4+/5, sensation WFL; some questionable depth perception issues)   Lower Extremity Assessment Lower Extremity Assessment: Defer to PT evaluation RLE Deficits / Details: MMT scores of 5 grossly bil; mild numbness at toes bil with reports of hx of peripheral neuropathy, but denied any numbness/tingling elsewhere;  coordination intact with testing; weakness and incoordination noted more with functional mobility RLE Sensation: history of peripheral neuropathy RLE Coordination: decreased gross motor LLE Deficits / Details: MMT scores of 5 grossly bil; mild numbness at toes bil with reports of hx of peripheral neuropathy, but denied any numbness/tingling elsewhere; coordination intact with testing; weakness and incoordination noted more with functional mobility LLE Sensation: history of peripheral neuropathy LLE Coordination: decreased gross motor   Cervical / Trunk Assessment Cervical / Trunk Assessment: Other exceptions Cervical / Trunk Exceptions: hx of chronic low back pain   Communication Communication Communication: No difficulties   Cognition Arousal/Alertness: Lethargic, Awake/alert Behavior During Therapy: WFL for tasks assessed/performed Overall Cognitive Status: No family/caregiver present to determine baseline cognitive functioning                                 General Comments: Pt lethargic initially but eventually woke up and became very interactive. Moments of decreased arousal where he would either close eyes majority of way or eyes would get foggy/roll back but still conversating with therapists, just slower. Pt difficult to follow at times with his conversations and seems to lose track of what he is talking about. also memory deficits noted - per chart, pt with fall in shower though pt does not recall thisq     General Comments  BP: 162/90 (106) & 93 bpm supine, 173/97 (114) & 97 bpm sitting after standing the first rep with pt getting shaky and eyes closing/rolling back but still holding conversation, 137/87 (102) & 94 bpm sitting after ambulating 2x; denied lightheadedness throughout; educated pt on his risk for falls  and this PT's recs for RW utilization, 24/7 supervision/assistance, and his wife to guard him closely; pt verbalized understanding    Exercises      Shoulder Instructions      Home Living Family/patient expects to be discharged to:: Private residence Living Arrangements: Spouse/significant other Available Help at Discharge: Family;Available 24 hours/day (but cannot provide physical assist) Type of Home: House Home Access: Stairs to enter [employer] of Steps: 3 Entrance Stairs-Rails:  (has 1 unsure of side) Home Layout: One level     Bathroom Shower/Tub: Producer, television/film/video: Handicapped height     Home Equipment: Shower seat - built Char[employer] fundraiser (2 wheels);Cane - single point;Other (comment);BSC/3in1 (walking sticks)          Prior Functioning/Environment Prior Level of Function : Independent/Modified Independent;Driving             Mobility Comments: No AD ADLs Comments: Indep with ADLs, IADLs, mowing yard, enjoys horses        OT Problem List: Decreased activity tolerance;Impaired balance (sitting and/or standing);Decreased cognition;Decreased knowledge of use of DME or AE      OT Treatment/Interventions: Self-care/ADL training;Therapeutic exercise;Energy conservation;DME and/or AE instruction;Therapeutic activities;Patient/family education    OT Goals(Current goals can be found in the care plan section) Acute Rehab OT Goals Patient Stated Goal: go home OT Goal Formulation: With patient Time For Goal Achievement: 11/08/22 Potential to Achieve Goals: Good  OT Frequency: Min 2X/week    Co-evaluation   Reason for Co-Treatment: Necessary to address cognition/behavior during functional activity;For patient/therapist safety;To address functional/ADL transfers PT goals addressed during session: Mobility/safety with mobility;Balance;Proper use of DME        AM-PAC OT "6 Clicks" Daily Activity     Outcome Measure Help from another person eating meals?: None (anticipate no issues but NPO) Help from another person taking care of personal grooming?: A Little Help from another person  toileting, which includes using toliet, bedpan, or urinal?: A Little Help from another person bathing (including washing, rinsing, drying)?: A Little Help from another person to put on and taking off regular upper body clothing?: A Little Help from another person to put on and taking off regular lower body clothing?: A Little 6 Click Score: 19   End of Session Equipment Utilized During Treatment: Gait belt;Rolling walker (2 wheels) Nurse Communication: Mobility status  Activity Tolerance: Patient tolerated treatment well Patient left: in chair;with call bell/phone within reach;with chair alarm set  OT Visit Diagnosis: Unsteadiness on feet (R26.81);Other abnormalities of gait and mobility (R26.89)                Time: 6045-4098 OT Time Calculation (min): 45 min Charges:  OT General Charges $OT Visit: 1 Visit OT Evaluation $OT Eval Moderate Complexity: 1 Mod  Bradd Canary, OTR/L Acute Rehab Services Office: [phone]   Lorre Munroe 10/25/2022, 9:20 AM

## 2022-10-25 NOTE — Evaluation (Signed)
Physical Therapy Evaluation Patient Details Name: Kirk Brown MRN: [id number] DOB: May 22, 1950 Today's Date: 10/25/2022  History of Present Illness  Pt is a 72 y.o. male who presented 10/24/22 with L-sided weakness and gait difficulty. Normal brain MRI. Normal intracranial MRA. PMH: DM2, HLD, HTN, gout, former smoker, sleep apnea not on CPAP, chronic back pain on chronic opioids, anxiety, restless leg syndrome   Clinical Impression  Pt presents with condition above and deficits mentioned below, see PT Problem List. PTA, he was independent without AD, living with his wife in a 1-level house with 3 STE. Currently, pt demonstrates questionable memory, attention, and awareness. No family present at time of eval to confirm baseline. Pt is also displaying WFL and intact bil lower extremity strength, sensation, and coordination with formal testing in sitting, other than baseline peripheral neuropathy at his bil toes. However, he displays deficits in strength and coordination in his lower extremities with functional mobility. Upon first coming to stand he became shaky and needed to sit after only standing briefly. Once sitting, he was able to maintain a slow conversation but his eyes became foggy and began to close and roll back while his trunk began to sway posteriorly. It is unclear what caused this, see BP measures below. He needed minA for transfers and to ambulate with a RW initially, but the more he mobilized the more his stability improved and became more independent, only needing min guard assist for safety by the end of the session. If he can obtain 24/7 assistance at d/c and they can provide the level of care he currently needs, then would recommend home with HHPT follow-up. Will continue to follow acutely.      Vitals:  162/90 (106) & 93 bpm supine 173/97 (114) & 97 bpm sitting after standing the first rep with pt getting shaky and eyes closing/rolling back but still holding conversation 137/87  (102) & 94 bpm sitting after ambulating 2x  *denied lightheadedness throughout    Recommendations for follow up therapy are one component of a multi-disciplinary discharge planning process, led by the attending physician.  Recommendations may be updated based on patient status, additional functional criteria and insurance authorization.  Follow Up Recommendations       Assistance Recommended at Discharge Frequent or constant Supervision/Assistance  Patient can return home with the following  A little help with walking and/or transfers;A little help with bathing/dressing/bathroom;Assistance with cooking/housework;Direct supervision/assist for financial management;Direct supervision/assist for medications management;Assist for transportation;Help with stairs or ramp for entrance    Equipment Recommendations Rolling walker (2 wheels);BSC/3in1 (if does not already have these, need to confirm with wife)  Recommendations for Other Services       Functional Status Assessment Patient has had a recent decline in their functional status and demonstrates the ability to make significant improvements in function in a reasonable and predictable amount of time.     Precautions / Restrictions Precautions Precautions: Fall Precaution Comments: watch BP Restrictions Weight Bearing Restrictions: No      Mobility  Bed Mobility Overal bed mobility: Needs Assistance Bed Mobility: Supine to Sit     Supine to sit: Supervision, HOB elevated     General bed mobility comments: Extra time and HOB elevated, supervision for safety    Transfers Overall transfer level: Needs assistance Equipment used: Rolling walker (2 wheels) Transfers: Sit to/from Stand Sit to Stand: Min assist, Min guard, +2 safety/equipment           General transfer comment: Extra time and effort to  power up to stand, improving with each rep. MinA the first 2 reps from EOB, sitting briefly after standing the first rep as  his legs became shaky. Min guard assist the 3rd rep from toilet.    Ambulation/Gait Ambulation/Gait assistance: Min assist, Min guard, +2 safety/equipment Gait Distance (Feet): 16 Feet (x2 bouts of ~16 ft each bout) Assistive device: Rolling walker (2 wheels) Gait Pattern/deviations: Step-through pattern, Decreased step length - right, Decreased step length - left, Decreased stride length Gait velocity: reduced Gait velocity interpretation: <1.31 ft/sec, indicative of household ambulator   General Gait Details: Pt takes slow, small steps, needing cues to keep the RW on the ground and just push it rather than lift it every other step. MinA initially but progressed to min guard for safety  Stairs            Wheelchair Mobility    Modified Rankin (Stroke Patients Only) Modified Rankin (Stroke Patients Only) Pre-Morbid Rankin Score: No symptoms Modified Rankin: Moderately severe disability     Balance Overall balance assessment: Needs assistance Sitting-balance support: No upper extremity supported, Feet supported Sitting balance-Leahy Scale: Fair Sitting balance - Comments: Supervision initially but pt with bouts of posterior leaning once sitting after standing the first time with eyes noted to be closing or rolling back but pt still conversating with therapists, min A intermittently to regain balance Postural control: Posterior lean Standing balance support: Bilateral upper extremity supported, No upper extremity supported, Single extremity supported Standing balance-Leahy Scale: Fair Standing balance comment: Able to stand at sink to perform ADLs without UE support intermittently but tends to prefer UE support, benefits from RW to ambulate                             Pertinent Vitals/Pain Pain Assessment Pain Assessment: Faces Faces Pain Scale: Hurts little more Pain Location: chronic back pain (more on R side of low back) Pain Descriptors / Indicators:  Discomfort, Grimacing, Guarding Pain Intervention(s): Limited activity within patient's tolerance, Monitored during session, Repositioned    Home Living Family/patient expects to be discharged to:: Private residence Living Arrangements: Spouse/significant other Available Help at Discharge: Family Type of Home: House Home Access: Stairs to enter Entrance Stairs-Rails:  (has 1 unsure of side) Entrance Stairs-Number of Steps: 3   Home Layout: One level Home Equipment: Shower seat - built [employer] fundraiser (2 wheels);Cane - single point;Other (comment);BSC/3in1 (walking sticks)      Prior Function Prior Level of Function : Independent/Modified Independent;Driving             Mobility Comments: No AD       Hand Dominance   Dominant Hand: Left    Extremity/Trunk Assessment   Upper Extremity Assessment Upper Extremity Assessment: Defer to OT evaluation    Lower Extremity Assessment Lower Extremity Assessment: RLE deficits/detail;LLE deficits/detail RLE Deficits / Details: MMT scores of 5 grossly bil; mild numbness at toes bil with reports of hx of peripheral neuropathy, but denied any numbness/tingling elsewhere; coordination intact with testing; weakness and incoordination noted more with functional mobility RLE Sensation: history of peripheral neuropathy RLE Coordination: decreased gross motor LLE Deficits / Details: MMT scores of 5 grossly bil; mild numbness at toes bil with reports of hx of peripheral neuropathy, but denied any numbness/tingling elsewhere; coordination intact with testing; weakness and incoordination noted more with functional mobility LLE Sensation: history of peripheral neuropathy LLE Coordination: decreased gross motor    Cervical / Trunk Assessment  Cervical / Trunk Assessment: Other exceptions Cervical / Trunk Exceptions: hx of chronic low back pain  Communication   Communication: No difficulties  Cognition Arousal/Alertness: Lethargic,  Awake/alert Behavior During Therapy: WFL for tasks assessed/performed Overall Cognitive Status: No family/caregiver present to determine baseline cognitive functioning                                 General Comments: Pt lethargic initially but eventually woke up and became very interactive. Moments of decreased arousal where he would either close eyes majority of way or eyes would get foggy/roll back but still conversating with therapists, just slower. Pt difficult to follow at times with his conversations and seems to lose track of what he is talking about.        General Comments General comments (skin integrity, edema, etc.): BP: 162/90 (106) & 93 bpm supine, 173/97 (114) & 97 bpm sitting after standing the first rep with pt getting shaky and eyes closing/rolling back but still holding conversation, 137/87 (102) & 94 bpm sitting after ambulating 2x; denied lightheadedness throughout; educated pt on his risk for falls and this PT's recs for RW utilization, 24/7 supervision/assistance, and his wife to guard him closely; pt verbalized understanding    Exercises     Assessment/Plan    PT Assessment Patient needs continued PT services  PT Problem List Decreased strength;Decreased activity tolerance;Decreased balance;Decreased mobility;Decreased coordination;Decreased cognition;Decreased knowledge of use of DME;Decreased safety awareness       PT Treatment Interventions DME instruction;Gait training;Stair training;Therapeutic activities;Functional mobility training;Therapeutic exercise;Balance training;Neuromuscular re-education;Cognitive remediation;Patient/family education    PT Goals (Current goals can be found in the Care Plan section)  Acute Rehab PT Goals Patient Stated Goal: to get back to his normal and go home PT Goal Formulation: With patient Time For Goal Achievement: 11/08/22 Potential to Achieve Goals: Good    Frequency Min 3X/week     Co-evaluation  PT/OT/SLP Co-Evaluation/Treatment: Yes Reason for Co-Treatment: Necessary to address cognition/behavior during functional activity;For patient/therapist safety;To address functional/ADL transfers PT goals addressed during session: Mobility/safety with mobility;Balance;Proper use of DME         AM-PAC PT "6 Clicks" Mobility  Outcome Measure Help needed turning from your back to your side while in a flat bed without using bedrails?: A Little Help needed moving from lying on your back to sitting on the side of a flat bed without using bedrails?: A Little Help needed moving to and from a bed to a chair (including a wheelchair)?: A Little Help needed standing up from a chair using your arms (e.g., wheelchair or bedside chair)?: A Little Help needed to walk in hospital room?: Total (<20 ft) Help needed climbing 3-5 steps with a railing? : Total 6 Click Score: 14    End of Session Equipment Utilized During Treatment: Gait belt Activity Tolerance: Patient tolerated treatment well Patient left: in chair;with call bell/phone within reach;with chair alarm set Nurse Communication: Mobility status PT Visit Diagnosis: Unsteadiness on feet (R26.81);Other abnormalities of gait and mobility (R26.89);Muscle weakness (generalized) (M62.81);Difficulty in walking, not elsewhere classified (R26.2)    Time: 1610-9604 PT Time Calculation (min) (ACUTE ONLY): 40 min   Charges:   PT Evaluation $PT Eval Moderate Complexity: 1 Mod PT Treatments $Therapeutic Activity: 8-22 mins        Raymond Gurney, PT, DPT Acute Rehabilitation Services  Office: [phone]   Jewel Baize 10/25/2022, 8:31 AM

## 2022-10-25 NOTE — Consult Note (Addendum)
Stroke Neurology Consultation Note  Consult Requested by: Dr. Allena Katz  Reason for Consult: left sided heaviness and difficulty walking  Consult Date: 10/25/22   The history was obtained from the pt and chart.  During history and examination, all items were able to obtain unless otherwise noted.  History of Present Illness:  Kirk Brown is a 72 y.o. Caucasian male with PMH of hypertension, hyperlipidemia, diabetes, RLS, gout, former smoker, lower back pain on chronic narcotics/spinal shots, central sleep apnea not on CPAP admitted for left-sided heaviness.  Code stroke activation last night and was seen by teleneurology.  He told teleneurologist that he was having shower around 7 PM and started to have left leg heaviness, he fell in the shower.  He was found to be drowsy sleepy and slurred speech during the encounter, but seems also stated that he may have left leg strange feeling/weakness during the lunch.  No TNK was given due to last seen well unclear.  CT no acute abnormality.  [facility] was [facility] for stroke workup.  Overnight MRI showed no acute abnormality, MRA negative.  He was loaded with Plavix and put on aspirin too.  He was found to have leukocytosis and AKI.  Today, he is awake alert, mental status at the baseline, still has pain of left thigh on standing up with walking, but no fall and left sided heaviness resolved.  Creatinine much improved, on IV fluid.  Leukocytosis also improved.  Per patient and wife, his sugar at home not in good control.  He has binge eating sugary foods, and not compliant with medication sometimes.  Chronic lower back pain with bilateral neuropathy, still has baseline left thigh pain, following with pain management as outpatient, has been given lumbar sacral injections and chronic narcotics.  LSN: Unclear, yesterday tPA Given: No: Unclear last seen well IR: No, no LVO MRS: 2  Past Medical History:  Diagnosis Date   Arthritis     Chronic back pain    Diabetes mellitus without complication (HCC)    Gout    Hyperlipemia    Hypertension    RLS (restless legs syndrome)    Wears glasses     Past Surgical History:  Procedure Laterality Date   ANKLE ARTHROSCOPY WITH ARTHRODESIS  03/12/2012   Procedure: ANKLE ARTHROSCOPY WITH ARTHRODESIS;  Surgeon: Toni Arthurs, MD;  Location: [facility];  Service: Orthopedics;  Laterality: Right;  Right Ankle Arthroscopy with Arthrodesis and  Bone Marrow Aspiration Right Hip (BMAC)   CARPAL TUNNEL RELEASE     both hands   COLONOSCOPY     ELBOW ARTHROSCOPY     both   INSERTION OF MESH N/A 11/02/2012   Procedure: INSERTION OF MESH;  Surgeon: Lodema Pilot, DO;  Location: [facility];  Service: General;  Laterality: N/A;   LAPAROSCOPY N/A 11/02/2012   Procedure: LAPAROSCOPY DIAGNOSTIC  bilateral inguinal hernia ;  Surgeon: Lodema Pilot, DO;  Location: [facility];  Service: General;  Laterality: N/A;   right fracture      right leg compound fracture-age 48   SHOULDER ARTHROSCOPY     lt   TONSILLECTOMY     UMBILICAL HERNIA REPAIR N/A 11/02/2012   Procedure: HERNIA REPAIR UMBILICAL ADULT;  Surgeon: Lodema Pilot, DO;  Location: [facility];  Service: General;  Laterality: N/A;    Family History  Problem Relation Age of Onset   Kidney Stones Mother    Hyperlipidemia Mother    Diabetes Mother    Alzheimer's  disease Mother    Kidney Stones Sister     Social History:  reports that he has been smoking cigarettes. He has never used smokeless tobacco. He reports current alcohol use of about 3.0 standard drinks of alcohol per week. He reports that he does not use drugs.  Allergies: No Known Allergies  No current facility-administered medications on file prior to encounter.   Current Outpatient Medications on File Prior to Encounter  Medication Sig Dispense Refill   allopurinol (ZYLOPRIM) 300 MG tablet Take 300 mg by mouth daily as needed (gout flare up). (Patient not taking: Reported on  06/21/2020)     clonazePAM (KLONOPIN) 0.5 MG tablet Take 0.5 mg by mouth daily. 1 tablet, BID and 2 tab at bedtime     glimepiride (AMARYL) 1 MG tablet Take 0.5 mg by mouth at bedtime.      metFORMIN (GLUCOPHAGE) 500 MG tablet 500 mg 3 (three) times daily. 500 mg in AM and 2 tab in evening     Morphine Sulfate ER 30 MG TBEA Take by mouth.      NARCAN 4 MG/0.1ML LIQD nasal spray kit      simvastatin (ZOCOR) 10 MG tablet       Review of Systems: A full ROS was attempted today and was able to be performed.  Systems assessed include - Constitutional, Eyes, HENT, Respiratory, Cardiovascular, Gastrointestinal, Genitourinary, Integument/breast, Hematologic/lymphatic, Musculoskeletal, Neurological, Behavioral/Psych, Endocrine, Allergic/Immunologic - with pertinent responses as per HPI.  Physical Examination: Temp:  [97.6 F (36.4 C)-100 F (37.8 C)] 98.9 F (37.2 C) (06/28 1129) Pulse Rate:  [80-107] 80 (06/28 1129) Resp:  [14-16] 16 (06/28 0823) BP: (121-178)/(76-101) 143/86 (06/28 1129) SpO2:  [92 %-97 %] 96 % (06/28 1129)  General - well nourished, well developed, in no apparent distress.    Ophthalmologic - fundi not visualized due to noncooperation.    Cardiovascular - regular rhythm and rate  Mental Status -  Level of arousal and orientation to time, place, and person were intact. Language including expression, naming, repetition, comprehension was assessed and found intact. Attention span and concentration were normal. Fund of Knowledge was assessed and was intact.  Cranial Nerves II - XII - II - Vision intact OU. III, IV, VI - Extraocular movements intact. V - Facial sensation intact bilaterally. VII - Facial movement intact bilaterally. VIII - Hearing & vestibular intact bilaterally. X - Palate elevates symmetrically. XI - Chin turning & shoulder shrug intact bilaterally. XII - Tongue protrusion intact.  Motor Strength - The patient's strength was normal in all extremities  and pronator drift was absent.   Motor Tone & Bulk - Muscle tone was assessed at the neck and appendages and was normal.  Bulk was normal and fasciculations were absent.   Reflexes - The patient's reflexes were normal in all extremities and he had no pathological reflexes.  Sensory - Light touch, temperature/pinprick were assessed and were normal.  But complaining of left thigh pain on walking and standing.  Coordination - The patient had normal movements in the hands and feet with no ataxia or dysmetria.  Tremor was absent.  Gait and Station - able to walk slowly with walker, no significant hemiparetic gait. But complaining of left thigh pain on walking and standing.  Data Reviewed: MR BRAIN WO CONTRAST  Result Date: 10/25/2022 CLINICAL DATA:  Initial evaluation for neuro deficit, stroke suspected. EXAM: MRI HEAD WITHOUT CONTRAST MRA HEAD WITHOUT CONTRAST TECHNIQUE: Multiplanar, multi-echo pulse sequences of the brain and surrounding structures were  acquired without intravenous contrast. Angiographic images of the Circle of Willis were acquired using MRA technique without intravenous contrast. COMPARISON:  Prior study from 10/24/2022. FINDINGS: MRI HEAD FINDINGS Brain: Cerebral volume within normal limits. No focal parenchymal signal abnormality or significant cerebral white matter disease. No evidence for acute or subacute ischemia. Gray-white matter differentiation maintained. No areas of chronic cortical infarction. No acute or chronic intracranial blood products. No mass lesion, midline shift or mass effect. No hydrocephalus or extra-axial fluid collection. Pituitary gland suprasellar region within normal limits. Vascular: Major intracranial vascular flow voids are maintained. Skull and upper cervical spine: Craniocervical junction within normal limits. Bone marrow signal intensity normal. No scalp soft tissue abnormality. Sinuses/Orbits: Globes and orbital soft tissues within normal limits. Mild  left frontoethmoidal scattered mucosal thickening noted about the paranasal sinuses, most notably at the left frontal ethmoidal region and right maxillary sinus. No air-fluid levels. No significant mastoid effusion. Other: None. MRA HEAD FINDINGS Anterior circulation: Both internal carotid arteries are widely patent to the termini without stenosis or other abnormality. A1 segments patent bilaterally. Normal anterior communicating complex. Anterior cerebral arteries patent without stenosis. No M1 stenosis or occlusion. No proximal MCA branch occlusion or high-grade stenosis. Distal MCA branches perfused and symmetric. Posterior circulation: Both vertebral arteries are widely patent without stenosis. Left vertebral artery dominant. Left PICA patent. Right PICA not well seen. Basilar mildly tortuous but is widely patent without stenosis. Superior cerebral arteries patent bilaterally. Fetal type origin of the left PCA. Right PCA supplied via a hypoplastic right P1 segment and robust right posterior communicating artery. Both PCAs patent to their distal aspects without stenosis. Anatomic variants: As above.  No aneurysm. IMPRESSION: MRI HEAD IMPRESSION: Normal brain MRI. No acute intracranial abnormality identified. MRA HEAD IMPRESSION: Normal intracranial MRA. Electronically Signed   By: Rise Mu M.D.   On: 10/25/2022 03:48   MR ANGIO HEAD WO CONTRAST  Result Date: 10/25/2022 CLINICAL DATA:  Initial evaluation for neuro deficit, stroke suspected. EXAM: MRI HEAD WITHOUT CONTRAST MRA HEAD WITHOUT CONTRAST TECHNIQUE: Multiplanar, multi-echo pulse sequences of the brain and surrounding structures were acquired without intravenous contrast. Angiographic images of the Circle of Willis were acquired using MRA technique without intravenous contrast. COMPARISON:  Prior study from 10/24/2022. FINDINGS: MRI HEAD FINDINGS Brain: Cerebral volume within normal limits. No focal parenchymal signal abnormality or  significant cerebral white matter disease. No evidence for acute or subacute ischemia. Gray-white matter differentiation maintained. No areas of chronic cortical infarction. No acute or chronic intracranial blood products. No mass lesion, midline shift or mass effect. No hydrocephalus or extra-axial fluid collection. Pituitary gland suprasellar region within normal limits. Vascular: Major intracranial vascular flow voids are maintained. Skull and upper cervical spine: Craniocervical junction within normal limits. Bone marrow signal intensity normal. No scalp soft tissue abnormality. Sinuses/Orbits: Globes and orbital soft tissues within normal limits. Mild left frontoethmoidal scattered mucosal thickening noted about the paranasal sinuses, most notably at the left frontal ethmoidal region and right maxillary sinus. No air-fluid levels. No significant mastoid effusion. Other: None. MRA HEAD FINDINGS Anterior circulation: Both internal carotid arteries are widely patent to the termini without stenosis or other abnormality. A1 segments patent bilaterally. Normal anterior communicating complex. Anterior cerebral arteries patent without stenosis. No M1 stenosis or occlusion. No proximal MCA branch occlusion or high-grade stenosis. Distal MCA branches perfused and symmetric. Posterior circulation: Both vertebral arteries are widely patent without stenosis. Left vertebral artery dominant. Left PICA patent. Right PICA not well seen. Basilar mildly  tortuous but is widely patent without stenosis. Superior cerebral arteries patent bilaterally. Fetal type origin of the left PCA. Right PCA supplied via a hypoplastic right P1 segment and robust right posterior communicating artery. Both PCAs patent to their distal aspects without stenosis. Anatomic variants: As above.  No aneurysm. IMPRESSION: MRI HEAD IMPRESSION: Normal brain MRI. No acute intracranial abnormality identified. MRA HEAD IMPRESSION: Normal intracranial MRA.  Electronically Signed   By: Rise Mu M.D.   On: 10/25/2022 03:48   CT HEAD CODE STROKE WO CONTRAST  Result Date: 10/24/2022 CLINICAL DATA:  Code stroke. Initial evaluation for neuro deficit, stroke, left-sided deficits. EXAM: CT HEAD WITHOUT CONTRAST TECHNIQUE: Contiguous axial images were obtained from the base of the skull through the vertex without intravenous contrast. RADIATION DOSE REDUCTION: This exam was performed according to the departmental dose-optimization program which includes automated exposure control, adjustment of the mA and/or kV according to patient size and/or use of iterative reconstruction technique. COMPARISON:  Prior MRI from 08/18/2016. FINDINGS: Brain: Mild age-related cerebral atrophy. No acute intracranial hemorrhage. No acute large vessel territory infarct. No mass lesion or midline shift. No hydrocephalus or extra-axial fluid collection. Vascular: No abnormal hyperdense vessel. Skull: Scalp soft tissues and calvarium demonstrate no acute finding. Sinuses/Orbits: Globes and orbital soft tissues within normal limits. Visualized paranasal sinuses and mastoid air cells are clear. Other: 9. ASPECTS ([place] Stroke Program Early CT Score) - Ganglionic level infarction (caudate, lentiform nuclei, internal capsule, insula, M1-M3 cortex): 7 - Supraganglionic infarction (M4-M6 cortex): 3 Total score (0-10 with 10 being normal): 10 IMPRESSION: 1. No acute intracranial abnormality. 2. ASPECTS is 10. 3. Mild age-related cerebral atrophy. Results were called by telephone at the time of interpretation on 10/24/2022 at 8:44 pm to provider [facility] , who verbally acknowledged these results. Electronically Signed   By: Rise Mu M.D.   On: 10/24/2022 20:45    Assessment: 72 y.o. male with PMH of hypertension, hyperlipidemia, diabetes, RLS, gout, former smoker, lower back pain on chronic narcotics/spinal shots, central sleep apnea not on CPAP admitted for drowsy,  sleepy, left-sided heaviness, slurred speech and fall at home during shower. LSW unclear.  CT no acute abnormality, MRI brain no acute infarct, MRA head normal. CUS unremarkable. EF 65-70%. LDL 92.  Creatinine baseline 0.76, but on admission his creatinine 2.05 and today 1.30 after hydration.  Also has leukocytosis WBC 13.7 and today down to 12.3.  Etiology for patient's symptoms likely due to encephalopathy in the setting of AKI and chronic lower back pain and leg pain.  However, patient does have stroke risk factors.  Recommend continue aspirin 81, increased home Zocor 10 to Lipitor 40.  Agree with continued hydration and monitoring creatinine.  Stroke Risk Factors - diabetes mellitus, hyperlipidemia, and hypertension  Plan: Neuro checks Telemetry monitoring Pending HgbA1C, UDS Continue IVF and monitoring Cre and WBC PT/OT and pain management per primary team Continue ASA 81 and increase zocor 10 to lipitor 40 Discussed with Dr. Allena Katz Neurology will sign off. Please call with questions. No neuro follow up needed at this time. Thanks for the consult.   Thank you for this consultation and allowing [place] to participate in the care of this patient.  Marvel Plan, MD PhD Stroke Neurology 10/25/2022 1:47 PM

## 2022-10-25 NOTE — Progress Notes (Signed)
Echocardiogram 2D Echocardiogram has been performed.  Warren Lacy [place] RDCS 10/25/2022, 9:09 AM

## 2022-10-26 DIAGNOSIS — R531 Weakness: Secondary | ICD-10-CM | POA: Diagnosis not present

## 2022-10-26 LAB — URINALYSIS, ROUTINE W REFLEX MICROSCOPIC
Bacteria, UA: NONE SEEN
Bilirubin Urine: NEGATIVE
Glucose, UA: NEGATIVE mg/dL
Ketones, ur: NEGATIVE mg/dL
Leukocytes,Ua: NEGATIVE
Nitrite: NEGATIVE
Protein, ur: 30 mg/dL — AB
Specific Gravity, Urine: 1.023 (ref 1.005–1.030)
pH: 5 (ref 5.0–8.0)

## 2022-10-26 LAB — HEMOGLOBIN A1C
Hgb A1c MFr Bld: 7.3 % — ABNORMAL HIGH (ref 4.8–5.6)
Mean Plasma Glucose: 163 mg/dL

## 2022-10-26 LAB — BASIC METABOLIC PANEL
Anion gap: 9 (ref 5–15)
BUN: 15 mg/dL (ref 8–23)
CO2: 28 mmol/L (ref 22–32)
Calcium: 8.7 mg/dL — ABNORMAL LOW (ref 8.9–10.3)
Chloride: 99 mmol/L (ref 98–111)
Creatinine, Ser: 0.78 mg/dL (ref 0.61–1.24)
GFR, Estimated: >60 mL/min (ref >60)
Glucose, Bld: 177 mg/dL — ABNORMAL HIGH (ref 70–99)
Potassium: 3.6 mmol/L (ref 3.5–5.1)
Sodium: 136 mmol/L (ref 135–145)

## 2022-10-26 LAB — GLUCOSE, CAPILLARY
Glucose-Capillary: 143 mg/dL — ABNORMAL HIGH (ref 70–99)
Glucose-Capillary: 161 mg/dL — ABNORMAL HIGH (ref 70–99)
Glucose-Capillary: 140 mg/dL — ABNORMAL HIGH (ref 70–99)

## 2022-10-26 MED ORDER — ATORVASTATIN CALCIUM 40 MG PO TABS: 40.0000 mg | ORAL_TABLET | Freq: Every day | ORAL | 0 refills | Status: AC

## 2022-10-26 MED ORDER — ASPIRIN 81 MG PO TBEC: 81.0000 mg | DELAYED_RELEASE_TABLET | Freq: Every day | ORAL | 0 refills | Status: AC

## 2022-10-26 NOTE — Plan of Care (Signed)

## 2022-10-26 NOTE — Discharge Summary (Signed)
Physician Discharge Summary   Patient: Kirk Brown MRN: [id number] DOB: Jan 03, 1951  Admit date:     10/24/2022  Discharge date: 10/26/22  Discharge Physician: Lynden Oxford  PCP: Irena Reichmann, DO  Recommendations at discharge: Follow-up with PCP in 1 week with a BMP.   Follow-up Information     [facility] Follow up.   Why: [facility]--the home health agency will contact you for the first home visit. Contact information: [address] [place] [address] [phone]         Irena Reichmann, DO. Schedule an appointment as soon as possible for a visit.   Specialty: Family Medicine Why: with BMP Contact information: [address] [place] [address] [place] [address] [phone]                Discharge Diagnoses: Principal Problem:   Left-sided weakness Active Problems:   AKI (acute kidney injury) (HCC)  Assessment and Plan  Left-sided weakness. Stroke ruled out. Presents with complaints of left-sided weakness after working out in the hot sun with some slurred speech. MRI brain negative for any acute stroke. Neurology was consulted. Recommended adding aspirin. No further weakness symptoms completely resolved. PT OT was ordered.  No therapy recommended on discharge.  AKI. Acute encephalopathy metabolic Most likely cause of his presentation Based on renal function normal. On admission serum creatinine 2.0 Recommend oral hydration while working outside.  Type of diabetes mellitus. Continue home regimen on discharge.  History of sleep apnea. Not using CPAP. Monitor.  Chronic pain syndrome. Continue home pain medication.  Anxiety. Continue home regimen.  Gout. Continuing gout medication allopurinol at home.  HLD. Continuing Lipitor on discharge stopping simvastatin.  Consultants:  Neurology  Procedures performed:  Echocardiogram  DISCHARGE MEDICATION: Allergies as of 10/26/2022   No Known  Allergies      Medication List     STOP taking these medications    simvastatin 10 MG tablet Commonly known as: ZOCOR       TAKE these medications    aspirin EC 81 MG tablet Take 1 tablet (81 mg total) by mouth daily. Swallow whole. Start taking on: October 27, 2022   atorvastatin 40 MG tablet Commonly known as: LIPITOR Take 1 tablet (40 mg total) by mouth daily. Start taking on: October 27, 2022   clonazePAM 0.5 MG tablet Commonly known as: KLONOPIN Take 0.5 mg by mouth 2 (two) times daily. 1 tablet, BID and 2 tab at bedtime   fluticasone 50 MCG/ACT nasal spray Commonly known as: FLONASE Place 1 spray into both nostrils daily.   glimepiride 1 MG tablet Commonly known as: AMARYL Take 0.5 mg by mouth daily.   metFORMIN 500 MG tablet Commonly known as: GLUCOPHAGE Take 500 mg by mouth 3 (three) times daily.   Morphine Sulfate ER 30 MG Tbea Take 1 tablet by mouth 2 (two) times daily.   Narcan 4 MG/0.1ML Liqd nasal spray kit Generic drug: naloxone   testosterone cypionate 200 MG/ML injection Commonly known as: DEPOTESTOSTERONE CYPIONATE Inject 200 mg into the muscle every 14 (fourteen) days.       Disposition: Home Diet recommendation: Cardiac diet  Discharge Exam: Vitals:   10/25/22 2300 10/26/22 0300 10/26/22 0726 10/26/22 1146  BP: 126/68 128/76 114/84 (!) 178/86  Pulse: 77 71 67 71  Resp: 18 18 18 18   Temp: 98.6 F (37 C) 98.6 F (37 C) 98.5 F (36.9 C) 98.3 F (36.8 C)  TempSrc: Oral Oral Oral  SpO2: 100% 98% 94% 100%   General: Appear in no distress; no visible Abnormal Neck Mass Or lumps, Conjunctiva normal Cardiovascular: S1 and S2 Present, no Murmur, Respiratory: good respiratory effort, Bilateral Air entry present and CTA, no Crackles, no wheezes Abdomen: Bowel Sound present, Non tender  Extremities: no Pedal edema Neurology: alert and oriented to time, place, and person   Condition at discharge: stable  The results of significant  diagnostics from this hospitalization (including imaging, microbiology, ancillary and laboratory) are listed below for reference.   Imaging Studies: VAS US CAROTID  Result Date: 10/25/2022 Carotid Arterial Duplex Study Patient Name:  PHENG SENTERS  Date of Exam:   10/25/2022 Medical Rec #: [id number]       Accession #:    [id number] Date of Birth: [birth year]        Patient Gender: M Patient Age:   72 years Exam Location:  [facility] Procedure:      VAS US CAROTID Referring Phys: Dow Adolph --------------------------------------------------------------------------------  Indications:  CVA. Risk Factors: Hypertension, hyperlipidemia, Diabetes, current smoker. Performing Technologist: Jean Rosenthal RDMS, RVT  Examination Guidelines: A complete evaluation includes B-mode imaging, spectral Doppler, color Doppler, and power Doppler as needed of all accessible portions of each vessel. Bilateral testing is considered an integral part of a complete examination. Limited examinations for reoccurring indications may be performed as noted.  Right Carotid Findings: +----------+--------+--------+--------+------------------+------------------+           PSV cm/sEDV cm/sStenosisPlaque DescriptionComments           +----------+--------+--------+--------+------------------+------------------+ CCA Prox  123     27                                                   +----------+--------+--------+--------+------------------+------------------+ CCA Distal118     38                                                   +----------+--------+--------+--------+------------------+------------------+ ICA Prox  98      29                                intimal thickening +----------+--------+--------+--------+------------------+------------------+ ICA Distal133     52                                                   +----------+--------+--------+--------+------------------+------------------+ ECA        150                                                          +----------+--------+--------+--------+------------------+------------------+ +----------+--------+-------+--------+-------------------+           PSV cm/sEDV cmsDescribeArm Pressure (mmHG) +----------+--------+-------+--------+-------------------+ [id number]            Stenotic                    +----------+--------+-------+--------+-------------------+ +---------+--------+--+--------+--+---------+  VertebralPSV cm/s62EDV cm/s19Antegrade +---------+--------+--+--------+--+---------+  Left Carotid Findings: +----------+--------+--------+--------+-----------------------------+--------+           PSV cm/sEDV cm/sStenosisPlaque Description           Comments +----------+--------+--------+--------+-----------------------------+--------+ CCA Prox  110     29                                                    +----------+--------+--------+--------+-----------------------------+--------+ CCA Distal103     34                                                    +----------+--------+--------+--------+-----------------------------+--------+ ICA Prox  104     39              smooth and hyperechoic , mild         +----------+--------+--------+--------+-----------------------------+--------+ ICA Distal103     36                                                    +----------+--------+--------+--------+-----------------------------+--------+ ECA       120     21                                                    +----------+--------+--------+--------+-----------------------------+--------+ +----------+--------+--------+----------------+-------------------+           PSV cm/sEDV cm/sDescribe        Arm Pressure (mmHG) +----------+--------+--------+----------------+-------------------+ [id number]             Multiphasic, WNL                     +----------+--------+--------+----------------+-------------------+ +---------+--------+--+--------+--+---------+ VertebralPSV cm/s92EDV cm/s28Antegrade +---------+--------+--+--------+--+---------+   Summary: Right Carotid: The extracranial vessels were near-normal with only minimal wall                thickening or plaque. Left Carotid: The extracranial vessels were near-normal with only minimal wall               thickening or plaque. Vertebrals:  Bilateral vertebral arteries demonstrate antegrade flow. Subclavians: Right subclavian artery was stenotic. Normal flow hemodynamics were              seen in the left subclavian artery. *See table(s) above for measurements and observations.     Preliminary    ECHOCARDIOGRAM COMPLETE BUBBLE STUDY  Result Date: 10/25/2022    ECHOCARDIOGRAM REPORT   Patient Name:   STODDARD TOWNE Date of Exam: 10/25/2022 Medical Rec #:  [id number]      Height:       71.0 in Accession #:    [id number]     Weight:       205.0 lb Date of Birth:  January 01, 1951       BSA:          2.131 m Patient Age:    72 years       BP:  121/76 mmHg Patient Gender: M              HR:           82 bpm. Exam Location:  Inpatient Procedure: 2D Echo, Color Doppler, Cardiac Doppler and Saline Contrast Bubble            Study Indications:    Stroke i63.9  History:        Patient has no prior history of Echocardiogram examinations.                 Risk Factors:Hypertension, Dyslipidemia and Diabetes.  Sonographer:    Irving Burton Senior RDCS Referring Phys: [id number] Oliver Pila HALL  Sonographer Comments: No parasternal window IMPRESSIONS  1. Difficult windows No parasternal views.  2. Left ventricular ejection fraction, by estimation, is 65 to 70%. The left ventricle has normal function. The left ventricle has no regional wall motion abnormalities. Left ventricular diastolic parameters are indeterminate.  3. Trivial mitral valve regurgitation.  4. Aortic valve regurgitation is mild. Aortic valve  sclerosis/calcification is present, without any evidence of aortic stenosis.  5. The inferior vena cava is normal in size with greater than 50% respiratory variability, suggesting right atrial pressure of 3 mmHg.  6. Agitated saline contrast bubble study was negative, with no evidence of any interatrial shunt. FINDINGS  Left Ventricle: Left ventricular ejection fraction, by estimation, is 65 to 70%. The left ventricle has normal function. The left ventricle has no regional wall motion abnormalities. The left ventricular internal cavity size was normal in size. Left ventricular diastolic parameters are indeterminate. Left Atrium: Left atrial size was normal in size. Right Atrium: Right atrial size was normal in size. Pericardium: There is no evidence of pericardial effusion. Mitral Valve: There is mild thickening of the mitral valve leaflet(s). Trivial mitral valve regurgitation. Tricuspid Valve: The tricuspid valve is normal in structure. Tricuspid valve regurgitation is trivial. Aortic Valve: Aortic valve regurgitation is mild. Aortic valve sclerosis/calcification is present, without any evidence of aortic stenosis. Pulmonic Valve: The pulmonic valve was not assessed. Aorta: Aortic root could not be assessed. Venous: The inferior vena cava is normal in size with greater than 50% respiratory variability, suggesting right atrial pressure of 3 mmHg. IAS/Shunts: No atrial level shunt detected by color flow Doppler. Agitated saline contrast was given intravenously to evaluate for intracardiac shunting. Agitated saline contrast bubble study was negative, with no evidence of any interatrial shunt.  LEFT VENTRICLE PLAX 2D LVOT diam:     1.90 cm   Diastology LV SV:         53        LV e' medial:    5.87 cm/s LV SV Index:   25        LV E/e' medial:  8.4 LVOT Area:     2.84 cm  LV e' lateral:   9.68 cm/s                          LV E/e' lateral: 5.1  RIGHT VENTRICLE RV S prime:     18.40 cm/s TAPSE (M-mode): 1.7 cm LEFT  ATRIUM             Index        RIGHT ATRIUM           Index LA Vol (A2C):   34.0 ml 15.96 ml/m  RA Area:     13.30 cm LA Vol (A4C):   39.9 ml 18.73  ml/m  RA Volume:   26.40 ml  12.39 ml/m LA Biplane Vol: 37.7 ml 17.69 ml/m  AORTIC VALVE LVOT Vmax:   128.00 cm/s LVOT Vmean:  87.400 cm/s LVOT VTI:    0.187 m MITRAL VALVE MV Area (PHT): 2.43 cm    SHUNTS MV Decel Time: 312 msec    Systemic VTI:  0.19 m MV E velocity: 49.30 cm/s  Systemic Diam: 1.90 cm MV A velocity: 72.60 cm/s MV E/A ratio:  0.68 Dietrich Pates MD Electronically signed by Dietrich Pates MD Signature Date/Time: 10/25/2022/3:07:00 PM    Final    MR BRAIN WO CONTRAST  Result Date: 10/25/2022 CLINICAL DATA:  Initial evaluation for neuro deficit, stroke suspected. EXAM: MRI HEAD WITHOUT CONTRAST MRA HEAD WITHOUT CONTRAST TECHNIQUE: Multiplanar, multi-echo pulse sequences of the brain and surrounding structures were acquired without intravenous contrast. Angiographic images of the Circle of Willis were acquired using MRA technique without intravenous contrast. COMPARISON:  Prior study from 10/24/2022. FINDINGS: MRI HEAD FINDINGS Brain: Cerebral volume within normal limits. No focal parenchymal signal abnormality or significant cerebral white matter disease. No evidence for acute or subacute ischemia. Gray-white matter differentiation maintained. No areas of chronic cortical infarction. No acute or chronic intracranial blood products. No mass lesion, midline shift or mass effect. No hydrocephalus or extra-axial fluid collection. Pituitary gland suprasellar region within normal limits. Vascular: Major intracranial vascular flow voids are maintained. Skull and upper cervical spine: Craniocervical junction within normal limits. Bone marrow signal intensity normal. No scalp soft tissue abnormality. Sinuses/Orbits: Globes and orbital soft tissues within normal limits. Mild left frontoethmoidal scattered mucosal thickening noted about the paranasal sinuses, most  notably at the left frontal ethmoidal region and right maxillary sinus. No air-fluid levels. No significant mastoid effusion. Other: None. MRA HEAD FINDINGS Anterior circulation: Both internal carotid arteries are widely patent to the termini without stenosis or other abnormality. A1 segments patent bilaterally. Normal anterior communicating complex. Anterior cerebral arteries patent without stenosis. No M1 stenosis or occlusion. No proximal MCA branch occlusion or high-grade stenosis. Distal MCA branches perfused and symmetric. Posterior circulation: Both vertebral arteries are widely patent without stenosis. Left vertebral artery dominant. Left PICA patent. Right PICA not well seen. Basilar mildly tortuous but is widely patent without stenosis. Superior cerebral arteries patent bilaterally. Fetal type origin of the left PCA. Right PCA supplied via a hypoplastic right P1 segment and robust right posterior communicating artery. Both PCAs patent to their distal aspects without stenosis. Anatomic variants: As above.  No aneurysm. IMPRESSION: MRI HEAD IMPRESSION: Normal brain MRI. No acute intracranial abnormality identified. MRA HEAD IMPRESSION: Normal intracranial MRA. Electronically Signed   By: Rise Mu M.D.   On: 10/25/2022 03:48   MR ANGIO HEAD WO CONTRAST  Result Date: 10/25/2022 CLINICAL DATA:  Initial evaluation for neuro deficit, stroke suspected. EXAM: MRI HEAD WITHOUT CONTRAST MRA HEAD WITHOUT CONTRAST TECHNIQUE: Multiplanar, multi-echo pulse sequences of the brain and surrounding structures were acquired without intravenous contrast. Angiographic images of the Circle of Willis were acquired using MRA technique without intravenous contrast. COMPARISON:  Prior study from 10/24/2022. FINDINGS: MRI HEAD FINDINGS Brain: Cerebral volume within normal limits. No focal parenchymal signal abnormality or significant cerebral white matter disease. No evidence for acute or subacute ischemia. Gray-white  matter differentiation maintained. No areas of chronic cortical infarction. No acute or chronic intracranial blood products. No mass lesion, midline shift or mass effect. No hydrocephalus or extra-axial fluid collection. Pituitary gland suprasellar region within normal limits. Vascular: Major intracranial vascular  flow voids are maintained. Skull and upper cervical spine: Craniocervical junction within normal limits. Bone marrow signal intensity normal. No scalp soft tissue abnormality. Sinuses/Orbits: Globes and orbital soft tissues within normal limits. Mild left frontoethmoidal scattered mucosal thickening noted about the paranasal sinuses, most notably at the left frontal ethmoidal region and right maxillary sinus. No air-fluid levels. No significant mastoid effusion. Other: None. MRA HEAD FINDINGS Anterior circulation: Both internal carotid arteries are widely patent to the termini without stenosis or other abnormality. A1 segments patent bilaterally. Normal anterior communicating complex. Anterior cerebral arteries patent without stenosis. No M1 stenosis or occlusion. No proximal MCA branch occlusion or high-grade stenosis. Distal MCA branches perfused and symmetric. Posterior circulation: Both vertebral arteries are widely patent without stenosis. Left vertebral artery dominant. Left PICA patent. Right PICA not well seen. Basilar mildly tortuous but is widely patent without stenosis. Superior cerebral arteries patent bilaterally. Fetal type origin of the left PCA. Right PCA supplied via a hypoplastic right P1 segment and robust right posterior communicating artery. Both PCAs patent to their distal aspects without stenosis. Anatomic variants: As above.  No aneurysm. IMPRESSION: MRI HEAD IMPRESSION: Normal brain MRI. No acute intracranial abnormality identified. MRA HEAD IMPRESSION: Normal intracranial MRA. Electronically Signed   By: Rise Mu M.D.   On: 10/25/2022 03:48   CT HEAD CODE STROKE WO  CONTRAST  Result Date: 10/24/2022 CLINICAL DATA:  Code stroke. Initial evaluation for neuro deficit, stroke, left-sided deficits. EXAM: CT HEAD WITHOUT CONTRAST TECHNIQUE: Contiguous axial images were obtained from the base of the skull through the vertex without intravenous contrast. RADIATION DOSE REDUCTION: This exam was performed according to the departmental dose-optimization program which includes automated exposure control, adjustment of the mA and/or kV according to patient size and/or use of iterative reconstruction technique. COMPARISON:  Prior MRI from 08/18/2016. FINDINGS: Brain: Mild age-related cerebral atrophy. No acute intracranial hemorrhage. No acute large vessel territory infarct. No mass lesion or midline shift. No hydrocephalus or extra-axial fluid collection. Vascular: No abnormal hyperdense vessel. Skull: Scalp soft tissues and calvarium demonstrate no acute finding. Sinuses/Orbits: Globes and orbital soft tissues within normal limits. Visualized paranasal sinuses and mastoid air cells are clear. Other: 9. ASPECTS ([place] Stroke Program Early CT Score) - Ganglionic level infarction (caudate, lentiform nuclei, internal capsule, insula, M1-M3 cortex): 7 - Supraganglionic infarction (M4-M6 cortex): 3 Total score (0-10 with 10 being normal): 10 IMPRESSION: 1. No acute intracranial abnormality. 2. ASPECTS is 10. 3. Mild age-related cerebral atrophy. Results were called by telephone at the time of interpretation on 10/24/2022 at 8:44 pm to provider [facility] , who verbally acknowledged these results. Electronically Signed   By: Rise Mu M.D.   On: 10/24/2022 20:45    Microbiology: Results for orders placed or performed during the hospital encounter of 10/23/12  Surgical pcr screen     Status: None   Collection Time: 10/23/12  8:44 AM   Specimen: Nasal Mucosa; Nasal Swab  Result Value Ref Range Status   MRSA, PCR NEGATIVE NEGATIVE Final   Staphylococcus aureus NEGATIVE  NEGATIVE Final    Comment:        The Xpert SA Assay (FDA approved for NASAL specimens in patients over 26 years of age), is one component of a comprehensive surveillance program.  Test performance has been validated by [employer] for patients greater than or equal to 81 year old. It is not intended to diagnose infection nor to guide or monitor treatment.   Labs: CBC: Recent Labs  Lab 10/24/22 2035 10/25/22 0610  WBC 13.7* 12.3*  NEUTROABS 10.7*  --   HGB 14.4 13.1  HCT 42.2 39.1  MCV 90.9 91.8  PLT 224 178   Basic Metabolic Panel: Recent Labs  Lab 10/24/22 2035 10/25/22 0610 10/26/22 1016  NA 142 140 136  K 4.1 4.1 3.6  CL 102 100 99  CO2 25 24 28   GLUCOSE 173* 164* 177*  BUN 31* 29* 15  CREATININE 2.05* 1.30* 0.78  CALCIUM 9.7 8.9 8.7*   Liver Function Tests: Recent Labs  Lab 10/24/22 2035  AST 24  ALT 20  ALKPHOS 39  BILITOT 0.5  PROT 8.0  ALBUMIN 4.8   CBG: Recent Labs  Lab 10/25/22 1701 10/25/22 2122 10/26/22 0359 10/26/22 0753 10/26/22 1148  GLUCAP 115* 202* 143* 161* 140*    Discharge time spent: greater than 30 minutes.  Author: Lynden Oxford, MD  [facility]

## 2022-10-26 NOTE — Progress Notes (Signed)
Physical Therapy Treatment Patient Details Name: Kirk Brown MRN: [id number] DOB: 1951/02/04 Today's Date: 10/26/2022   History of Present Illness Pt is a 72 y.o. male who presented 10/24/22 with L-sided weakness and gait difficulty. Normal brain MRI. Normal intracranial MRA. PMH: DM2, HLD, HTN, gout, former smoker, sleep apnea not on CPAP, chronic back pain on chronic opioids, anxiety, restless leg syndrome    PT Comments    Doing great physically today, able to ambulate with light RW use at a mod I level. Complains of Lt lateral thigh pain that began after he slipped in the shower PTA. Safely navigates stairs without physical assistance. Oriented and appropriate however noted IV had been pulled out prior to session (RN notified) likely out of frustration but noted abnormal behavior comments from prior PT evaluation. From a mobility standpoint, pt is safe for d/c home with PRN supervision from wife. He has a RW that he will use. May benefit from OPPT follow-up for Lt thigh soreness. Will continue to follow acutely during admission.    Recommendations for follow up therapy are one component of a multi-disciplinary discharge planning process, led by the attending physician.  Recommendations may be updated based on patient status, additional functional criteria and insurance authorization.     Assistance Recommended at Discharge PRN  Patient can return home with the following Assistance with cooking/housework;A little help with bathing/dressing/bathroom;Assist for transportation   Equipment Recommendations  None recommended by PT       Precautions / Restrictions Precautions Precautions: Fall Precaution Comments: watch BP Restrictions Weight Bearing Restrictions: No     Mobility  Bed Mobility               General bed mobility comments: In recliner    Transfers Overall transfer level: Modified independent Equipment used: Rolling walker (2 wheels) Transfers: Sit to/from  Stand Sit to Stand: Modified independent (Device/Increase time)           General transfer comment: Mod I with light RW for support upon standing.    Ambulation/Gait Ambulation/Gait assistance: Supervision, Modified independent (Device/Increase time) Gait Distance (Feet): 400 Feet Assistive device: Rolling walker (2 wheels), None Gait Pattern/deviations: Step-through pattern, Decreased stride length, Antalgic Gait velocity: reduced Gait velocity interpretation: <1.8 ft/sec, indicate of risk for recurrent falls   General Gait Details: Minor instability noted without AD for support, showing some antalgic patterning when WB through LLE. Improves greatly with light support from RW. Educated on use and progressed to Mod I level with this device.   Stairs Stairs: Yes Stairs assistance: Supervision Stair Management: Two rails, Step to pattern, Alternating pattern, Forwards Number of Stairs: 5 (x2) General stair comments: Safely navigates stairs, cues for sequencing. Able to perform alternating pattern without LOB. States he feels confident with technique.   Wheelchair Mobility    Modified Rankin (Stroke Patients Only) Modified Rankin (Stroke Patients Only) Pre-Morbid Rankin Score: No symptoms Modified Rankin: No significant disability     Balance Overall balance assessment: Needs assistance Sitting-balance support: No upper extremity supported, Feet supported Sitting balance-Leahy Scale: Normal     Standing balance support: No upper extremity supported Standing balance-Leahy Scale: Fair Standing balance comment: Feels more confident with AD                            Cognition Arousal/Alertness: Awake/alert Behavior During Therapy: WFL for tasks assessed/performed Overall Cognitive Status: Within Functional Limits for tasks assessed  General Comments: Approriate today, a bit frustrated as he wants to leave asap.         Exercises      General Comments General comments (skin integrity, edema, etc.): denies any symptoms during session, other than Lt thigh discomfort, desribed as muscle soreness when pressing into lateral thigh.      Pertinent Vitals/Pain Pain Assessment Pain Assessment: Faces Faces Pain Scale: Hurts a little bit Pain Location: Lt thigh Pain Descriptors / Indicators: Discomfort, Sore Pain Intervention(s): Monitored during session, Repositioned, Utilized relaxation techniques    Home Living                          Prior Function            PT Goals (current goals can now be found in the care plan section) Acute Rehab PT Goals Patient Stated Goal: to get back to his normal and go home PT Goal Formulation: With patient Time For Goal Achievement: 11/08/22 Potential to Achieve Goals: Good Progress towards PT goals: Progressing toward goals    Frequency    Min 3X/week      PT Plan Current plan remains appropriate    Co-evaluation              AM-PAC PT "6 Clicks" Mobility   Outcome Measure  Help needed turning from your back to your side while in a flat bed without using bedrails?: None Help needed moving from lying on your back to sitting on the side of a flat bed without using bedrails?: None Help needed moving to and from a bed to a chair (including a wheelchair)?: None Help needed standing up from a chair using your arms (e.g., wheelchair or bedside chair)?: None Help needed to walk in hospital room?: None Help needed climbing 3-5 steps with a railing? : A Little 6 Click Score: 23    End of Session Equipment Utilized During Treatment: Gait belt Activity Tolerance: Patient tolerated treatment well Patient left: in chair;with call bell/phone within reach;with chair alarm set Nurse Communication: Mobility status PT Visit Diagnosis: Unsteadiness on feet (R26.81);Other abnormalities of gait and mobility (R26.89);Muscle weakness (generalized)  (M62.81);Difficulty in walking, not elsewhere classified (R26.2);Pain Pain - Right/Left: Left Pain - part of body: Leg     Time: 1610-9604 PT Time Calculation (min) (ACUTE ONLY): 22 min  Charges:  $Gait Training: 8-22 mins                     Kathlyn Sacramento, PT, DPT [facility]  Rehabilitation Services Physical Therapist Office: [phone] Website: [email or web address]    Kirk Brown 10/26/2022, 10:06 AM

## 2022-10-26 NOTE — Progress Notes (Signed)
Occupational Therapy Treatment Patient Details Name: Kirk Brown MRN: [id number] DOB: [birth year] Today's Date: 10/26/2022   History of present illness Pt is a 72 y.o. male who presented 10/24/22 with L-sided weakness and gait difficulty. Normal brain MRI. Normal intracranial MRA. PMH: DM2, HLD, HTN, gout, former smoker, sleep apnea not on CPAP, chronic back pain on chronic opioids, anxiety, restless leg syndrome   OT comments  Pt. Seen for skilled OT treatment session.  Pt. Able to demo ability to safely reach bles for lb adls.  Safe tech. For ambulation to/from b.room for toileting needs.  Able to verbalize fall prevention strategies and ones already in place at home.  Reports good support from spouse available at home.  Will cont. With acute OT POC.  Agree with current d/c recommendations.      Recommendations for follow up therapy are one component of a multi-disciplinary discharge planning process, led by the attending physician.  Recommendations may be updated based on patient status, additional functional criteria and insurance authorization.    Assistance Recommended at Discharge Set up Supervision/Assistance  Patient can return home with the following  A little help with bathing/dressing/bathroom;Assistance with cooking/housework;Direct supervision/assist for medications management;Direct supervision/assist for financial management;Help with stairs or ramp for entrance;Assist for transportation   Equipment Recommendations       Recommendations for Other Services      Precautions / Restrictions Precautions Precautions: Fall Precaution Comments: watch BP Restrictions Weight Bearing Restrictions: No       Mobility Bed Mobility               General bed mobility comments: In recliner    Transfers Overall transfer level: Modified independent Equipment used: Rolling walker (2 wheels) Transfers: Sit to/from Stand, Bed to chair/wheelchair/BSC Sit to Stand: Modified  independent (Device/Increase time)           General transfer comment: Mod I with light RW for support upon standing.     Balance                                           ADL either performed or assessed with clinical judgement   ADL Overall ADL's : Needs assistance/impaired             Lower Body Bathing: Sitting/lateral leans;Supervison/ safety Lower Body Bathing Details (indicate cue type and reason): able to demo ability to reach bles in seated position         Toilet Transfer: Supervision/safety;Rolling walker (2 wheels)   Toileting- Clothing Manipulation and Hygiene: Supervision/safety;Sitting/lateral lean       Functional mobility during ADLs: Min guard;Rolling walker (2 wheels) General ADL Comments: able to verbalize fall prevention strategies in place at home-state "oh yeah we got rid of our rugs years ago"    Extremity/Trunk Assessment              Vision       Perception     Praxis      Cognition Arousal/Alertness: Awake/alert Behavior During Therapy: WFL for tasks assessed/performed Overall Cognitive Status: Within Functional Limits for tasks assessed                                          Exercises      Shoulder Instructions  General Comments denies any symptoms during session, other than Lt thigh discomfort, desribed as muscle soreness when pressing into lateral thigh.    Pertinent Vitals/ Pain       Pain Assessment Pain Assessment: No/denies pain  Home Living                                          Prior Functioning/Environment              Frequency  Min 2X/week        Progress Toward Goals  OT Goals(current goals can now be found in the care plan section)  Progress towards OT goals: Progressing toward goals     Plan Discharge plan remains appropriate    Co-evaluation                 AM-PAC OT "6 Clicks" Daily Activity     Outcome  Measure   Help from another person eating meals?: None Help from another person taking care of personal grooming?: A Little Help from another person toileting, which includes using toliet, bedpan, or urinal?: A Little Help from another person bathing (including washing, rinsing, drying)?: A Little Help from another person to put on and taking off regular upper body clothing?: A Little Help from another person to put on and taking off regular lower body clothing?: A Little 6 Click Score: 19    End of Session Equipment Utilized During Treatment: Gait belt;Rolling walker (2 wheels)  OT Visit Diagnosis: Unsteadiness on feet (R26.81);Other abnormalities of gait and mobility (R26.89)   Activity Tolerance Patient tolerated treatment well   Patient Left in chair;with call bell/phone within reach   Nurse Communication Other (comment) (rn states she will turn on chair alarm after phlebotomy finishes with him (they arrived at end of my session))        Time: [phone] OT Time Calculation (min): 19 min  Charges: OT General Charges $OT Visit: 1 Visit OT Treatments $Self Care/Home Management : 8-22 mins  Boneta Lucks, COTA/L Acute Rehabilitation [phone]   Alessandra Bevels Lorraine-COTA/L 10/26/2022, 12:44 PM

## 2022-10-28 LAB — GLUCOSE, CAPILLARY
Glucose-Capillary: 144 mg/dL — ABNORMAL HIGH (ref 70–99)
Glucose-Capillary: 194 mg/dL — ABNORMAL HIGH (ref 70–99)

## 2022-10-29 DIAGNOSIS — D649 Anemia, unspecified: Secondary | ICD-10-CM | POA: Diagnosis not present

## 2022-10-29 DIAGNOSIS — E1140 Type 2 diabetes mellitus with diabetic neuropathy, unspecified: Secondary | ICD-10-CM | POA: Diagnosis not present

## 2022-10-29 DIAGNOSIS — E785 Hyperlipidemia, unspecified: Secondary | ICD-10-CM | POA: Diagnosis not present

## 2022-10-29 DIAGNOSIS — M109 Gout, unspecified: Secondary | ICD-10-CM | POA: Diagnosis not present

## 2022-10-29 DIAGNOSIS — E291 Testicular hypofunction: Secondary | ICD-10-CM | POA: Diagnosis not present

## 2022-10-29 DIAGNOSIS — Z79899 Other long term (current) drug therapy: Secondary | ICD-10-CM | POA: Diagnosis not present

## 2022-11-05 DIAGNOSIS — E785 Hyperlipidemia, unspecified: Secondary | ICD-10-CM | POA: Diagnosis not present

## 2022-11-05 DIAGNOSIS — E1140 Type 2 diabetes mellitus with diabetic neuropathy, unspecified: Secondary | ICD-10-CM | POA: Diagnosis not present

## 2022-11-05 DIAGNOSIS — R030 Elevated blood-pressure reading, without diagnosis of hypertension: Secondary | ICD-10-CM | POA: Diagnosis not present

## 2022-11-05 DIAGNOSIS — G8929 Other chronic pain: Secondary | ICD-10-CM | POA: Diagnosis not present

## 2022-11-05 DIAGNOSIS — M109 Gout, unspecified: Secondary | ICD-10-CM | POA: Diagnosis not present

## 2022-11-05 DIAGNOSIS — D649 Anemia, unspecified: Secondary | ICD-10-CM | POA: Diagnosis not present

## 2022-11-05 DIAGNOSIS — Z79899 Other long term (current) drug therapy: Secondary | ICD-10-CM | POA: Diagnosis not present

## 2022-11-05 DIAGNOSIS — F419 Anxiety disorder, unspecified: Secondary | ICD-10-CM | POA: Diagnosis not present

## 2022-11-05 DIAGNOSIS — E291 Testicular hypofunction: Secondary | ICD-10-CM | POA: Diagnosis not present

## 2022-11-05 DIAGNOSIS — E1142 Type 2 diabetes mellitus with diabetic polyneuropathy: Secondary | ICD-10-CM | POA: Diagnosis not present

## 2022-11-05 DIAGNOSIS — Z0000 Encounter for general adult medical examination without abnormal findings: Secondary | ICD-10-CM | POA: Diagnosis not present

## 2022-11-25 DIAGNOSIS — F411 Generalized anxiety disorder: Secondary | ICD-10-CM | POA: Diagnosis not present

## 2022-11-25 DIAGNOSIS — F410 Panic disorder [episodic paroxysmal anxiety] without agoraphobia: Secondary | ICD-10-CM | POA: Diagnosis not present

## 2022-11-25 DIAGNOSIS — F321 Major depressive disorder, single episode, moderate: Secondary | ICD-10-CM | POA: Diagnosis not present

## 2022-12-18 DIAGNOSIS — F321 Major depressive disorder, single episode, moderate: Secondary | ICD-10-CM | POA: Diagnosis not present

## 2022-12-18 DIAGNOSIS — F411 Generalized anxiety disorder: Secondary | ICD-10-CM | POA: Diagnosis not present

## 2022-12-18 DIAGNOSIS — F410 Panic disorder [episodic paroxysmal anxiety] without agoraphobia: Secondary | ICD-10-CM | POA: Diagnosis not present

## 2023-01-15 DIAGNOSIS — F321 Major depressive disorder, single episode, moderate: Secondary | ICD-10-CM | POA: Diagnosis not present

## 2023-01-15 DIAGNOSIS — F411 Generalized anxiety disorder: Secondary | ICD-10-CM | POA: Diagnosis not present

## 2023-01-15 DIAGNOSIS — F410 Panic disorder [episodic paroxysmal anxiety] without agoraphobia: Secondary | ICD-10-CM | POA: Diagnosis not present

## 2023-02-04 DIAGNOSIS — E1140 Type 2 diabetes mellitus with diabetic neuropathy, unspecified: Secondary | ICD-10-CM | POA: Diagnosis not present

## 2023-02-04 DIAGNOSIS — E785 Hyperlipidemia, unspecified: Secondary | ICD-10-CM | POA: Diagnosis not present

## 2023-02-11 DIAGNOSIS — Z23 Encounter for immunization: Secondary | ICD-10-CM | POA: Diagnosis not present

## 2023-02-11 DIAGNOSIS — I517 Cardiomegaly: Secondary | ICD-10-CM | POA: Diagnosis not present

## 2023-02-11 DIAGNOSIS — D649 Anemia, unspecified: Secondary | ICD-10-CM | POA: Diagnosis not present

## 2023-02-11 DIAGNOSIS — R030 Elevated blood-pressure reading, without diagnosis of hypertension: Secondary | ICD-10-CM | POA: Diagnosis not present

## 2023-02-11 DIAGNOSIS — I272 Pulmonary hypertension, unspecified: Secondary | ICD-10-CM | POA: Diagnosis not present

## 2023-02-11 DIAGNOSIS — M109 Gout, unspecified: Secondary | ICD-10-CM | POA: Diagnosis not present

## 2023-02-11 DIAGNOSIS — E785 Hyperlipidemia, unspecified: Secondary | ICD-10-CM | POA: Diagnosis not present

## 2023-02-11 DIAGNOSIS — G8929 Other chronic pain: Secondary | ICD-10-CM | POA: Diagnosis not present

## 2023-02-11 DIAGNOSIS — E1142 Type 2 diabetes mellitus with diabetic polyneuropathy: Secondary | ICD-10-CM | POA: Diagnosis not present

## 2023-02-11 DIAGNOSIS — E291 Testicular hypofunction: Secondary | ICD-10-CM | POA: Diagnosis not present

## 2023-02-11 DIAGNOSIS — E1140 Type 2 diabetes mellitus with diabetic neuropathy, unspecified: Secondary | ICD-10-CM | POA: Diagnosis not present

## 2023-02-11 DIAGNOSIS — F419 Anxiety disorder, unspecified: Secondary | ICD-10-CM | POA: Diagnosis not present

## 2023-02-19 DIAGNOSIS — I272 Pulmonary hypertension, unspecified: Secondary | ICD-10-CM | POA: Diagnosis not present

## 2023-02-19 DIAGNOSIS — I517 Cardiomegaly: Secondary | ICD-10-CM | POA: Diagnosis not present

## 2023-04-14 DIAGNOSIS — F321 Major depressive disorder, single episode, moderate: Secondary | ICD-10-CM | POA: Diagnosis not present

## 2023-04-14 DIAGNOSIS — F411 Generalized anxiety disorder: Secondary | ICD-10-CM | POA: Diagnosis not present

## 2023-04-14 DIAGNOSIS — F410 Panic disorder [episodic paroxysmal anxiety] without agoraphobia: Secondary | ICD-10-CM | POA: Diagnosis not present

## 2023-05-07 DIAGNOSIS — E291 Testicular hypofunction: Secondary | ICD-10-CM | POA: Diagnosis not present

## 2023-05-07 DIAGNOSIS — Z79899 Other long term (current) drug therapy: Secondary | ICD-10-CM | POA: Diagnosis not present

## 2023-05-07 DIAGNOSIS — M109 Gout, unspecified: Secondary | ICD-10-CM | POA: Diagnosis not present

## 2023-05-07 DIAGNOSIS — E1140 Type 2 diabetes mellitus with diabetic neuropathy, unspecified: Secondary | ICD-10-CM | POA: Diagnosis not present

## 2023-05-07 DIAGNOSIS — E785 Hyperlipidemia, unspecified: Secondary | ICD-10-CM | POA: Diagnosis not present

## 2023-05-14 DIAGNOSIS — I10 Essential (primary) hypertension: Secondary | ICD-10-CM | POA: Diagnosis not present

## 2023-05-14 DIAGNOSIS — E1140 Type 2 diabetes mellitus with diabetic neuropathy, unspecified: Secondary | ICD-10-CM | POA: Diagnosis not present

## 2023-05-14 DIAGNOSIS — G8929 Other chronic pain: Secondary | ICD-10-CM | POA: Diagnosis not present

## 2023-05-14 DIAGNOSIS — E291 Testicular hypofunction: Secondary | ICD-10-CM | POA: Diagnosis not present

## 2023-05-14 DIAGNOSIS — F419 Anxiety disorder, unspecified: Secondary | ICD-10-CM | POA: Diagnosis not present

## 2023-05-14 DIAGNOSIS — G629 Polyneuropathy, unspecified: Secondary | ICD-10-CM | POA: Diagnosis not present

## 2023-05-14 DIAGNOSIS — Z79899 Other long term (current) drug therapy: Secondary | ICD-10-CM | POA: Diagnosis not present

## 2023-05-14 DIAGNOSIS — E782 Mixed hyperlipidemia: Secondary | ICD-10-CM | POA: Diagnosis not present

## 2023-05-14 DIAGNOSIS — M109 Gout, unspecified: Secondary | ICD-10-CM | POA: Diagnosis not present

## 2023-08-05 DIAGNOSIS — E1140 Type 2 diabetes mellitus with diabetic neuropathy, unspecified: Secondary | ICD-10-CM | POA: Diagnosis not present

## 2023-08-05 DIAGNOSIS — M109 Gout, unspecified: Secondary | ICD-10-CM | POA: Diagnosis not present

## 2023-08-05 DIAGNOSIS — E782 Mixed hyperlipidemia: Secondary | ICD-10-CM | POA: Diagnosis not present

## 2023-08-07 DIAGNOSIS — M4716 Other spondylosis with myelopathy, lumbar region: Secondary | ICD-10-CM | POA: Diagnosis not present

## 2023-08-07 DIAGNOSIS — E1140 Type 2 diabetes mellitus with diabetic neuropathy, unspecified: Secondary | ICD-10-CM | POA: Diagnosis not present

## 2023-08-07 DIAGNOSIS — Z79899 Other long term (current) drug therapy: Secondary | ICD-10-CM | POA: Diagnosis not present

## 2023-08-07 DIAGNOSIS — F419 Anxiety disorder, unspecified: Secondary | ICD-10-CM | POA: Diagnosis not present

## 2023-08-07 DIAGNOSIS — I10 Essential (primary) hypertension: Secondary | ICD-10-CM | POA: Diagnosis not present

## 2023-08-07 DIAGNOSIS — E782 Mixed hyperlipidemia: Secondary | ICD-10-CM | POA: Diagnosis not present

## 2023-08-07 DIAGNOSIS — M109 Gout, unspecified: Secondary | ICD-10-CM | POA: Diagnosis not present

## 2023-08-07 DIAGNOSIS — G629 Polyneuropathy, unspecified: Secondary | ICD-10-CM | POA: Diagnosis not present

## 2023-08-07 DIAGNOSIS — E291 Testicular hypofunction: Secondary | ICD-10-CM | POA: Diagnosis not present

## 2023-08-07 DIAGNOSIS — F331 Major depressive disorder, recurrent, moderate: Secondary | ICD-10-CM | POA: Diagnosis not present

## 2023-08-07 DIAGNOSIS — G8929 Other chronic pain: Secondary | ICD-10-CM | POA: Diagnosis not present

## 2023-08-20 DIAGNOSIS — M5412 Radiculopathy, cervical region: Secondary | ICD-10-CM | POA: Diagnosis not present

## 2023-08-20 DIAGNOSIS — M5416 Radiculopathy, lumbar region: Secondary | ICD-10-CM | POA: Diagnosis not present

## 2023-08-20 DIAGNOSIS — Z6828 Body mass index (BMI) 28.0-28.9, adult: Secondary | ICD-10-CM | POA: Diagnosis not present

## 2023-09-09 DIAGNOSIS — M4802 Spinal stenosis, cervical region: Secondary | ICD-10-CM | POA: Diagnosis not present

## 2023-09-09 DIAGNOSIS — M419 Scoliosis, unspecified: Secondary | ICD-10-CM | POA: Diagnosis not present

## 2023-09-09 DIAGNOSIS — M4722 Other spondylosis with radiculopathy, cervical region: Secondary | ICD-10-CM | POA: Diagnosis not present

## 2023-09-09 DIAGNOSIS — M545 Low back pain, unspecified: Secondary | ICD-10-CM | POA: Diagnosis not present

## 2023-09-09 DIAGNOSIS — M4721 Other spondylosis with radiculopathy, occipito-atlanto-axial region: Secondary | ICD-10-CM | POA: Diagnosis not present

## 2023-09-09 DIAGNOSIS — M5412 Radiculopathy, cervical region: Secondary | ICD-10-CM | POA: Diagnosis not present

## 2023-09-09 DIAGNOSIS — M542 Cervicalgia: Secondary | ICD-10-CM | POA: Diagnosis not present

## 2023-09-09 DIAGNOSIS — M5116 Intervertebral disc disorders with radiculopathy, lumbar region: Secondary | ICD-10-CM | POA: Diagnosis not present

## 2023-09-09 DIAGNOSIS — M48061 Spinal stenosis, lumbar region without neurogenic claudication: Secondary | ICD-10-CM | POA: Diagnosis not present

## 2023-09-09 DIAGNOSIS — M5416 Radiculopathy, lumbar region: Secondary | ICD-10-CM | POA: Diagnosis not present

## 2023-09-24 ENCOUNTER — Ambulatory Visit (HOSPITAL_COMMUNITY)
Admission: EM | Admit: 2023-09-24 | Discharge: 2023-09-24 | Disposition: A | Discharge status: Home / Self Care | Attending: Psychiatry | Admitting: Psychiatry

## 2023-09-24 DIAGNOSIS — M5489 Other dorsalgia: Secondary | ICD-10-CM | POA: Insufficient documentation

## 2023-09-24 DIAGNOSIS — G8929 Other chronic pain: Secondary | ICD-10-CM | POA: Diagnosis not present

## 2023-09-24 DIAGNOSIS — Z79891 Long term (current) use of opiate analgesic: Secondary | ICD-10-CM | POA: Diagnosis not present
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
## 2023-09-24 NOTE — Progress Notes (Signed)
   09/24/23 1322  [facility] Triage Screening (Walk-ins at [facility] only)  How Did You Hear About Us ? Family/Friend  What Is the Reason for Your Visit/Call Today? Kirk Brown presents to [facility] voluntarily accompanied by his wife. Pt states that he has been on morphine  tablets for 8 years. Pt states that his pharmacy won't have any for a month. Pt states that he has been cutting what he does have in half but that's not working especially at night. Pt states that he take 60 mg of morphine  tablets a day (one in the AM and one in the PM). Pt states that he would like to get off the morphine  pills. Pt currently denies SI, HI, AVH and alcohol/drug use.  How Long Has This Been Causing You Problems? 1 wk - 1 month  Have You Recently Had Any Thoughts About Hurting Yourself? No  Are You Planning to Commit Suicide/Harm Yourself At This time? No  Have you Recently Had Thoughts About Hurting Someone [name]? No  Are You Planning To Harm Someone At This Time? No  Physical Abuse Denies  Verbal Abuse Denies  Sexual Abuse Denies  Exploitation of patient/patient's resources Denies  Self-Neglect Denies  Are you currently experiencing any auditory, visual or other hallucinations? No  Have You Used Any Alcohol or Drugs in the Past 24 Hours? No  Do you have any current medical co-morbidities that require immediate attention? Yes  Please describe current medical co-morbidities that require immediate attention: diabetes  Clinician description of patient physical appearance/behavior: pleasant, calm, cooperative  What Do You Feel Would Help You the Most Today? Alcohol or Drug Use Treatment  If access to [facility] was not available, would you have sought care in the Emergency Department? No  Determination of Need Routine (7 days)  Options For Referral Medication Management;Geropsychiatric Facility

## 2023-09-24 NOTE — ED Notes (Signed)
 Discharged and escorted out of building by provider.

## 2023-09-24 NOTE — Discharge Instructions (Addendum)
 Discharge recommendations:   Medications: Patient is to take medications as prescribed. The patient or patient's guardian is to contact a medical professional and/or outpatient provider to address any new side effects that develop. The patient or the patient's guardian should update outpatient providers of any new medications and/or medication changes.    Outpatient Follow up: Please review list of outpatient resources for psychiatry and counseling. Please follow up with your primary care provider for all medical related needs.    Therapy: We recommend that patient participate in individual therapy to address mental health concerns.   Atypical antipsychotics: If you are prescribed an atypical antipsychotic, it is recommended that your height, weight, BMI, blood pressure, fasting lipid panel, and fasting blood sugar be monitored by your outpatient providers.  Safety:   The following safety precautions should be taken:   No sharp objects. This includes scissors, razors, scrapers, and putty knives.   Chemicals should be removed and locked up.   Medications should be removed and locked up.   Weapons should be removed and locked up. This includes firearms, knives and instruments that can be used to cause injury.   The patient should abstain from use of illicit substances/drugs and abuse of any medications.  If symptoms worsen or do not continue to improve or if the patient becomes actively suicidal or homicidal then it is recommended that the patient return to the closest hospital emergency department, the [facility], or call 911 for further evaluation and treatment. National Suicide Prevention Lifeline 1-800-SUICIDE or 438 395 9627.  About 988 988 offers 24/7 access to trained crisis counselors who can help people experiencing mental health-related distress. People can call or text 988 or chat 988lifeline.org for themselves or if they are worried about a  loved one who may need crisis support.   Follow up with PCP ASAP

## 2023-09-27 ENCOUNTER — Other Ambulatory Visit (HOSPITAL_BASED_OUTPATIENT_CLINIC_OR_DEPARTMENT_OTHER): Payer: Self-pay

## 2023-09-27 ENCOUNTER — Emergency Department (HOSPITAL_BASED_OUTPATIENT_CLINIC_OR_DEPARTMENT_OTHER): Admission: EM | Admit: 2023-09-27 | Discharge: 2023-09-27 | Disposition: A | Discharge status: Home / Self Care

## 2023-09-27 ENCOUNTER — Encounter (HOSPITAL_BASED_OUTPATIENT_CLINIC_OR_DEPARTMENT_OTHER): Payer: Self-pay | Admitting: Emergency Medicine

## 2023-09-27 ENCOUNTER — Other Ambulatory Visit: Payer: Self-pay

## 2023-09-27 DIAGNOSIS — Z7982 Long term (current) use of aspirin: Secondary | ICD-10-CM | POA: Diagnosis not present

## 2023-09-27 DIAGNOSIS — M792 Neuralgia and neuritis, unspecified: Secondary | ICD-10-CM | POA: Insufficient documentation

## 2023-09-27 HISTORY — DX: Polyneuropathy, unspecified: G62.9

## 2023-09-27 MED ORDER — GABAPENTIN 100 MG PO CAPS
100.0000 mg | ORAL_CAPSULE | Freq: Three times a day (TID) | ORAL | 0 refills | Status: AC
  Filled 2023-09-27: qty 30, 10d supply, fill #0

## 2023-09-27 MED ORDER — TRAMADOL HCL 50 MG PO TABS
50.0000 mg | ORAL_TABLET | Freq: Once | ORAL | Status: AC
  Administered 2023-09-27: 50 mg via ORAL
  Filled 2023-09-27: qty 1

## 2023-09-27 MED ORDER — GABAPENTIN 100 MG PO CAPS
100.0000 mg | ORAL_CAPSULE | Freq: Once | ORAL | Status: AC
  Administered 2023-09-27: 100 mg via ORAL
  Filled 2023-09-27: qty 1

## 2023-09-27 MED ORDER — TRAMADOL HCL 50 MG PO TABS
50.0000 mg | ORAL_TABLET | Freq: Four times a day (QID) | ORAL | 0 refills | Status: AC | PRN
  Filled 2023-09-27: qty 30, 8d supply, fill #0

## 2023-09-27 NOTE — ED Triage Notes (Signed)
 Pt has recently detoxed from morphine  ,he states he is through that but his back pain and neuropathy has been intolerable.

## 2023-09-27 NOTE — Discharge Instructions (Addendum)
 Follow up with your doctor or with Pain Management for recheck and assessment of pain control with the new medications prescribed today.   Return to the ED as needed for uncontrolled pain or new symptom of concern.

## 2023-09-27 NOTE — ED Provider Notes (Signed)
 [place] EMERGENCY DEPARTMENT AT [facility] Provider Note   CSN: [id number] Arrival date & time: 09/27/23  5284     History  Chief Complaint  Patient presents with   Pain Management    Kirk Brown is a 73 y.o. male.  Patient to ED with neuropathic pain in bilateral LE's. He has been prescribed Morphine  30 mg BID for many years which is now no longer available. He experienced symptoms of agitation, poor sleep, diarrhea for several days. No nausea or vomiting. He is in the emergency department after tapering down from his usual dose and all symptoms of withdrawal have resolved. He is having pain that is typical of his neuropathic pain in both feet that is sharp, sometimes extending to proximal lower legs. No swelling or redness. He states he does not want to continue Morphine  and is asking for something to control his pain.  The history is provided by the patient and the spouse. No language interpreter was used.       Home Medications Prior to Admission medications   Medication Sig Start Date End Date Taking? Authorizing Provider  gabapentin (NEURONTIN) 100 MG capsule Take 1 capsule (100 mg total) by mouth 3 (three) times daily. 09/27/23  Yes Ramsha Lonigro, Clovis Dar, PA-C  traMADol  (ULTRAM ) 50 MG tablet Take 1 tablet (50 mg total) by mouth every 6 (six) hours as needed for moderate pain (pain score 4-6). 09/27/23  Yes Mandy Second, PA-C  aspirin  EC 81 MG tablet Take 1 tablet (81 mg total) by mouth daily. Swallow whole. 10/27/22   Kraig Peru, MD  atorvastatin  (LIPITOR) 40 MG tablet Take 1 tablet (40 mg total) by mouth daily. 10/27/22   Kraig Peru, MD  clonazePAM  (KLONOPIN ) 0.5 MG tablet Take 0.5 mg by mouth 2 (two) times daily. 1 tablet, BID and 2 tab at bedtime 12/29/18   [provider]  fluticasone (FLONASE) 50 MCG/ACT nasal spray Place 1 spray into both nostrils daily. 06/16/21   [provider]  glimepiride (AMARYL) 1 MG tablet Take 0.5 mg by mouth  daily.    [provider]  metFORMIN  (GLUCOPHAGE ) 500 MG tablet Take 500 mg by mouth 3 (three) times daily.    [provider]  Morphine  Sulfate ER 30 MG TBEA Take 1 tablet by mouth 2 (two) times daily.    [provider]  NARCAN 4 MG/0.1ML LIQD nasal spray kit  11/10/18   [provider]  testosterone  cypionate (DEPOTESTOSTERONE CYPIONATE) 200 MG/ML injection Inject 200 mg into the muscle every 14 (fourteen) days. 09/16/22   [provider]      Allergies    Patient has no known allergies.    Review of Systems   Review of Systems  Physical Exam Updated Vital Signs BP (!) 151/85 (BP Location: Right Arm)   Pulse 73   Temp 98.4 F (36.9 C) (Oral)   Resp 18   Wt 88.9 kg   SpO2 99%   BMI 27.34 kg/m  Physical Exam Vitals and nursing note reviewed.  Constitutional:      Appearance: He is well-developed.  Cardiovascular:     Pulses: Normal pulses.  Pulmonary:     Effort: Pulmonary effort is normal.  Musculoskeletal:        General: No swelling or tenderness. Normal range of motion.     Cervical back: Normal range of motion.     Right lower leg: No edema.     Left lower leg: No edema.  Skin:  General: Skin is warm and dry.     Coloration: Skin is not pale.     Findings: No bruising or erythema.  Neurological:     Mental Status: He is alert and oriented to person, place, and time.     Sensory: No sensory deficit.     ED Results / Procedures / Treatments   Labs (all labs ordered are listed, but only abnormal results are displayed) Labs Reviewed - No data to display  EKG None  Radiology No results found.  Procedures Procedures    Medications Ordered in ED Medications  gabapentin (NEURONTIN) capsule 100 mg (has no administration in time range)  traMADol  (ULTRAM ) tablet 50 mg (50 mg Oral Given 09/27/23 1005)    ED Course/ Medical Decision Making/ A&P Clinical Course as of 09/27/23 1007  Sat Sep 27, 2023  0959  Patient to ED with medication concern. Is coming off oral morphine  he has taken for neuropathic pain he has had for many years and is symptomatic. He has been referred to Pain Management but this appointment is about 2 weeks away. Exam does not suggest acute process or infection. Discussed treatment options with patient, spouse and with pharmacist. Will start gabapentin 100 mg TID and expect either PCP or Pain Mgmt to titrate up. Will provide Tramadol . Patient is comfortable with this medication. Recommend close PCP follow up.  [SU]    Clinical Course User Index [SU] Mandy Second, PA-C                                 Medical Decision Making          Final Clinical Impression(s) / ED Diagnoses Final diagnoses:  Neuropathic pain    Rx / DC Orders ED Discharge Orders          Ordered    gabapentin (NEURONTIN) 100 MG capsule  3 times daily        09/27/23 1006    traMADol  (ULTRAM ) 50 MG tablet  Every 6 hours PRN        09/27/23 1006              Mandy Second, PA-C 09/27/23 1007    Carin Charleston, MD 09/28/23 [phone]

## 2023-09-27 NOTE — ED Notes (Addendum)
 DC paperwork given and verbally understood... Pt understood no drinking/driving due to the meds that were given.Kirk AasAaron Brown

## 2023-10-07 DIAGNOSIS — G8929 Other chronic pain: Secondary | ICD-10-CM | POA: Diagnosis not present

## 2023-10-07 DIAGNOSIS — G629 Polyneuropathy, unspecified: Secondary | ICD-10-CM | POA: Diagnosis not present

## 2023-10-09 DIAGNOSIS — M5412 Radiculopathy, cervical region: Secondary | ICD-10-CM | POA: Diagnosis not present

## 2023-10-09 DIAGNOSIS — M47816 Spondylosis without myelopathy or radiculopathy, lumbar region: Secondary | ICD-10-CM | POA: Diagnosis not present

## 2023-10-09 DIAGNOSIS — M5416 Radiculopathy, lumbar region: Secondary | ICD-10-CM | POA: Diagnosis not present

## 2023-10-15 ENCOUNTER — Ambulatory Visit: Admitting: Podiatry

## 2023-10-15 DIAGNOSIS — E1142 Type 2 diabetes mellitus with diabetic polyneuropathy: Secondary | ICD-10-CM | POA: Diagnosis not present

## 2023-10-15 NOTE — Progress Notes (Unsigned)
 Subjective:  Patient ID: Kirk Brown, male    DOB: 06-Nov-1950,  MRN: [id number]  Chief Complaint  Patient presents with   Peripheral Neuropathy    73 y.o. male presents with the above complaint.  Patient presents with bilateral neuropathy.  Patient states been dealing with this for quite some time wanted to get it evaluated.  He has tried gabapentin  and Lyrica which has failed.  He wanted to discuss other treatment for neuropathy.  Denies any other acute complaints has not been seen by anyone else prior to seeing me he has a history of chronic back pain as well as diabetes which could likely be leading to this.   Review of Systems: Negative except as noted in the HPI. Denies N/V/F/Ch.  Past Medical History:  Diagnosis Date   Arthritis    Chronic back pain    Diabetes mellitus without complication (HCC)    Gout    Hyperlipemia    Hypertension    Neuropathy    RLS (restless legs syndrome)    Wears glasses     Current Outpatient Medications:    allopurinol  (ZYLOPRIM ) 300 MG tablet, Take 300 mg by mouth daily., Disp: , Rfl:    aspirin  EC 81 MG tablet, Take 1 tablet (81 mg total) by mouth daily. Swallow whole., Disp: 30 tablet, Rfl: 0   atorvastatin  (LIPITOR) 40 MG tablet, Take 1 tablet (40 mg total) by mouth daily., Disp: 30 tablet, Rfl: 0   clonazePAM  (KLONOPIN ) 0.5 MG tablet, Take 0.5 mg by mouth 2 (two) times daily. 1 tablet, BID and 2 tab at bedtime, Disp: , Rfl:    fluticasone (FLONASE) 50 MCG/ACT nasal spray, Place 1 spray into both nostrils daily., Disp: , Rfl:    gabapentin  (NEURONTIN ) 100 MG capsule, Take 1 capsule (100 mg total) by mouth 3 (three) times daily., Disp: 30 capsule, Rfl: 0   glimepiride (AMARYL) 1 MG tablet, Take 0.5 mg by mouth daily., Disp: , Rfl:    metFORMIN  (GLUCOPHAGE ) 500 MG tablet, Take 500 mg by mouth 3 (three) times daily., Disp: , Rfl:    MOUNJARO 2.5 MG/0.5ML Pen, SMARTSIG:0.5 Milliliter(s) Once a Week, Disp: , Rfl:    NARCAN 4 MG/0.1ML LIQD nasal  spray kit, , Disp: , Rfl:    oxyCODONE  (OXY IR/ROXICODONE ) 5 MG immediate release tablet, 1 every 8 hours per patient, Disp: , Rfl:    simvastatin (ZOCOR) 20 MG tablet, SMARTSIG:1 Tablet(s) By Mouth Every Evening, Disp: , Rfl:    testosterone  cypionate (DEPOTESTOSTERONE CYPIONATE) 200 MG/ML injection, Inject 200 mg into the muscle every 14 (fourteen) days. (Patient not taking: Reported on 10/21/2023), Disp: , Rfl:    traMADol  (ULTRAM ) 50 MG tablet, Take 1 tablet (50 mg total) by mouth every 6 (six) hours as needed for moderate pain (pain score 4-6)., Disp: 30 tablet, Rfl: 0  Social History   Tobacco Use  Smoking Status Every Day   Current packs/day: 0.00   Types: Cigarettes   Last attempt to quit: 03/09/1989   Years since quitting: 34.6  Smokeless Tobacco Never    No Known Allergies Objective:  There were no vitals filed for this visit. There is no height or weight on file to calculate BMI. Constitutional Well developed. Well nourished.  Vascular Dorsalis pedis pulses palpable bilaterally. Posterior tibial pulses palpable bilaterally. Capillary refill normal to all digits.  No cyanosis or clubbing noted. Pedal hair growth normal.  Neurologic Normal speech. Oriented to person, place, and time. Decreased protective sensation noted.  Decreased vibratory  sensation noted.  Subjective complaint numbness tingling noted to both of his feet  Dermatologic Nails well groomed and normal in appearance. No open wounds. No skin lesions.  Orthopedic: Manual muscle strength 5 out of 5.  No deformities noted of the foot and ankle   Radiographs: None Assessment:   1. Diabetic peripheral neuropathy associated with type 2 diabetes mellitus (HCC)    Plan:  Patient was evaluated and treated and all questions answered.  Bilateral severe neuropathy secondary to diabetes - All questions and concerns were discussed with the patient in extensive detail given the amount of neuropathy that is present  patient will benefit from advanced treatment for peripheral neuropathy.  He will be referred to Dr. Marcelino for further management - Referral order was placed  No follow-ups on file.

## 2023-10-21 ENCOUNTER — Encounter: Payer: Self-pay | Admitting: Student in an Organized Health Care Education/Training Program

## 2023-10-21 ENCOUNTER — Ambulatory Visit
Attending: Student in an Organized Health Care Education/Training Program | Admitting: Student in an Organized Health Care Education/Training Program

## 2023-10-21 VITALS — BP 161/83 | HR 68 | Temp 97.3°F | Resp 16 | Ht 69.0 in | Wt 197.0 lb

## 2023-10-21 DIAGNOSIS — E1140 Type 2 diabetes mellitus with diabetic neuropathy, unspecified: Secondary | ICD-10-CM | POA: Diagnosis not present

## 2023-10-21 DIAGNOSIS — E1142 Type 2 diabetes mellitus with diabetic polyneuropathy: Secondary | ICD-10-CM | POA: Insufficient documentation

## 2023-10-21 DIAGNOSIS — M792 Neuralgia and neuritis, unspecified: Secondary | ICD-10-CM | POA: Insufficient documentation

## 2023-10-21 DIAGNOSIS — G8929 Other chronic pain: Secondary | ICD-10-CM | POA: Diagnosis not present

## 2023-10-21 DIAGNOSIS — Z7984 Long term (current) use of oral hypoglycemic drugs: Secondary | ICD-10-CM | POA: Diagnosis not present

## 2023-10-21 DIAGNOSIS — G894 Chronic pain syndrome: Secondary | ICD-10-CM | POA: Insufficient documentation

## 2023-10-21 NOTE — Progress Notes (Signed)
 PROVIDER NOTE: Interpretation of information contained herein should be left to medically-trained personnel. Specific patient instructions are provided elsewhere under Patient Instructions section of medical record. This document was created in part using AI and STT-dictation technology, any transcriptional errors that may result from this process are unintentional.  Patient: Kirk Brown  Service: E/M Encounter  Provider: Wallie Sherry, MD  DOB: 02-01-1951  Delivery: Face-to-face  Specialty: Interventional Pain Management  MRN: [id number]  Setting: Ambulatory outpatient facility  Specialty designation: 09  Type: New Patient  Location: Outpatient office facility  PCP: Gerome Brunet, DO  DOS: 10/21/2023    Referring Prov.: Tobie Franky SQUIBB, DPM   Primary Reason(s) for Visit: Encounter for initial evaluation of one or more chronic problems (new to examiner) potentially causing chronic pain, and posing a threat to normal musculoskeletal function. (Level of risk: High) CC: Foot Pain (Neuropathy )  HPI  Kirk Brown is a 73 y.o. year old, male patient, who comes for the first time to our practice referred by Tobie Franky SQUIBB, DPM for our initial evaluation of his chronic pain. He has Gout; HTN (hypertension); Other and unspecified hyperlipidemia; Obesity, unspecified; Metabolic syndrome; DM (diabetes mellitus) (HCC); Hyperglycemia; Deep inguinal pain; Other malaise and fatigue; Dysphonia on examination; Complaint related to dreams; Chronically on opiate therapy; Former smoker, stopped smoking in distant past; Benzodiazepine contract exists; Bilateral foot pain; Left-sided weakness; AKI (acute kidney injury) (HCC); Chronic painful diabetic neuropathy (HCC); Diabetic polyneuropathy associated with type 2 diabetes mellitus (HCC); Chronic neuropathic pain; and Chronic pain syndrome on their problem list. Today he comes in for evaluation of his Foot Pain (Neuropathy )  Pain Assessment: Location: Right, Left  Foot Radiating: Radaites from feet bilateral uptowards knee bilateral Onset: More than a month ago Duration: Neuropathic pain Quality: Constant, Tingling, Shooting, Numbness Severity: 7 /10 (subjective, self-reported pain score)  Effect on ADL: Limits ADLs. Unable to sleep at night due to pain. Timing: Constant Modifying factors: Walking BP: (!) 161/83  HR: 68  Onset and Duration: Present longer than 3 months Cause of pain: Unknown Severity: Getting worse, NAS-11 at its worse: 10/10, NAS-11 at its best: 3/10, NAS-11 now: 7/10, and NAS-11 on the average: 7/10 Timing: Night Aggravating Factors: Prolonged sitting Alleviating Factors: Walking Associated Problems: Numbness and Pain that wakes patient up Quality of Pain: Burning and Exhausting Previous Examinations or Tests: Spinal tap and The patient denies tests Previous Treatments: Narcotic medications  Kirk Brown is being evaluated for possible interventional pain management therapies for the treatment of his chronic pain.    History of Present Illness   Kirk Brown is a 73 year old male with diabetic neuropathy who presents with burning and tingling in his feet. He was referred by Dr. Tobie for evaluation of his diabetic neuropathy.  He experiences burning and tingling sensations in his feet, described as 'working burning,' which significantly impacts his sleep, allowing him only about three hours of rest the previous night.  He has a history of diabetic neuropathy causing severe pain in his feet, with pain sometimes radiating down his legs and out of his toes, causing him to 'come out of the chair.' Platelet-rich plasma (PRP) treatment previously alleviated the shooting pains, but the burning and tingling persist.  He states that he has had lumbar spinal epidural steroid injections in the past which were not helpful.  These were done at a pain clinic in [place].  He has done physical therapy in the past with limited  response.  He tries to  do home stretching of his feet.  His condition has been worsening, with occasional numbness in the feet making it difficult to feel the bottoms of them.  He is retired, previously worked as an Stage manager, and moved from [employer] [place], [place] . No history of spine surgery.       Meds   Current Outpatient Medications:    allopurinol  (ZYLOPRIM ) 300 MG tablet, Take 300 mg by mouth daily., Disp: , Rfl:    aspirin  EC 81 MG tablet, Take 1 tablet (81 mg total) by mouth daily. Swallow whole., Disp: 30 tablet, Rfl: 0   atorvastatin  (LIPITOR) 40 MG tablet, Take 1 tablet (40 mg total) by mouth daily., Disp: 30 tablet, Rfl: 0   clonazePAM  (KLONOPIN ) 0.5 MG tablet, Take 0.5 mg by mouth 2 (two) times daily. 1 tablet, BID and 2 tab at bedtime, Disp: , Rfl:    fluticasone (FLONASE) 50 MCG/ACT nasal spray, Place 1 spray into both nostrils daily., Disp: , Rfl:    gabapentin  (NEURONTIN ) 100 MG capsule, Take 1 capsule (100 mg total) by mouth 3 (three) times daily., Disp: 30 capsule, Rfl: 0   glimepiride (AMARYL) 1 MG tablet, Take 0.5 mg by mouth daily., Disp: , Rfl:    metFORMIN  (GLUCOPHAGE ) 500 MG tablet, Take 500 mg by mouth 3 (three) times daily., Disp: , Rfl:    MOUNJARO 2.5 MG/0.5ML Pen, SMARTSIG:0.5 Milliliter(s) Once a Week, Disp: , Rfl:    NARCAN 4 MG/0.1ML LIQD nasal spray kit, , Disp: , Rfl:    oxyCODONE  (OXY IR/ROXICODONE ) 5 MG immediate release tablet, 1 every 8 hours per patient, Disp: , Rfl:    simvastatin (ZOCOR) 20 MG tablet, SMARTSIG:1 Tablet(s) By Mouth Every Evening, Disp: , Rfl:    traMADol  (ULTRAM ) 50 MG tablet, Take 1 tablet (50 mg total) by mouth every 6 (six) hours as needed for moderate pain (pain score 4-6)., Disp: 30 tablet, Rfl: 0   testosterone  cypionate (DEPOTESTOSTERONE CYPIONATE) 200 MG/ML injection, Inject 200 mg into the muscle every 14 (fourteen) days. (Patient not taking: Reported on 10/21/2023), Disp: , Rfl:   Imaging Review  Cervical  Imaging: Cervical MR wo contrast: Results for orders placed during the hospital encounter of 09/24/15  MR Cervical Spine Wo Contrast  Narrative CLINICAL DATA:  73 year old male with neck pain and severe left arm numbness down to thumb. No known injury. Subsequent encounter.  EXAM: MRI CERVICAL SPINE WITHOUT CONTRAST  TECHNIQUE: Multiplanar, multisequence MR imaging of the cervical spine was performed. No intravenous contrast was administered.  COMPARISON:  10/13/2014 plain film exam. No comparison cervical spine MR.  FINDINGS: Exam is slightly motion degraded.  Alignment: Normal.  Vertebrae: No focal abnormality.  Cord: Question tiny area of gliosis/edema versus artifact within the cord at the C5-6 level.  Posterior Fossa, vertebral arteries, paraspinal tissues: Negative.  Disc levels:  C2-3: Minimal Schmorl's node deformity inferior aspects C2. No spinal stenosis or foraminal narrowing.  C3-4: Shallow broad-based disc osteophyte complex slightly greater to the right. Mild narrowing ventral thecal sac greater on right. Uncinate hypertrophy. Moderate right-sided and mild left-sided foraminal narrowing.  C4-5: Broad-based disc osteophyte complex greater to left. Narrowing ventral thecal sac greater on left. Left uncinate hypertrophy with moderate-to-marked left foraminal narrowing.  C5-6: Moderate broad-based disc osteophyte complex slightly greater to the right. Ventral thecal sac narrowing with mild right-sided cord flattening. Uncinate hypertrophy greater on the right. Moderate to marked right-sided and mild to moderate left-sided foraminal narrowing.  C6-7: Shallow broad-based disc osteophyte complex.  Narrowing ventral thecal sac. Uncinate hypertrophy with mild bilateral foraminal narrowing.  C7-T1: Minimal bulge. Minimal facet degenerative changes. Minimal foraminal narrowing.  T1-2:  Minimal bulge.  T2-3:  Minimal bulge.  IMPRESSION: Summary of  pertinent findings includes:  C3-4 shallow broad-based disc osteophyte complex slightly greater to the right. Mild narrowing ventral thecal sac greater on right. Moderate right-sided and mild left-sided foraminal narrowing.  C4-5 broad-based disc osteophyte complex greater to left. Narrowing ventral thecal sac greater on left. Left uncinate hypertrophy with moderate-to-marked left foraminal narrowing.  C5-6 moderate broad-based disc osteophyte complex slightly greater to the right. Ventral thecal sac narrowing with mild right-sided cord flattening. Moderate to marked right-sided and mild to moderate left-sided foraminal narrowing.  C6-7 shallow broad-based disc osteophyte complex. Narrowing ventral thecal sac. Mild bilateral foraminal narrowing.  Question tiny area of gliosis/edema versus artifact within the cord at the C5-6 level.   Electronically Signed By: Elspeth Slain M.D. On: 09/24/2015 09:29   DG Shoulder Right  Narrative CLINICAL DATA:  Chronic RIGHT shoulder pain for years worsened for 3 months, restricted range of motion  EXAM: RIGHT SHOULDER - 2+ VIEW  COMPARISON:  None  FINDINGS: Osseous mineralization normal.  Mild widening of the [facility] joint with superior subluxation of the distal clavicle and small corticated ossicles compatible with remote grade 3 AC separation.  No acute fracture or dislocation.  Calcifications adjacent to the greater tuberosity of the RIGHT humerus likely reflect calcific tendinitis in the rotator cuff.  Visualized RIGHT ribs intact.  IMPRESSION: Chronic calcific tendinitis of the RIGHT rotator cuff.  Probable remote grade 3 AC separation.  No acute abnormalities.   Electronically Signed By: Oneil Kiss M.D. On: 11/03/2017 15:51   MR LUMBAR SPINE WO CONTRAST  Narrative CLINICAL DATA:  73 year old male with chronic low back pain radiating to the bilateral buttocks legs and feet. Spinal injection 1 month ago without  improvement. Numbness in both feet.  EXAM: MRI LUMBAR SPINE WITHOUT CONTRAST  TECHNIQUE: Multiplanar, multisequence MR imaging of the lumbar spine was performed. No intravenous contrast was administered.  COMPARISON:  Lumbar MRI 02/24/2015. CT Abdomen and Pelvis 10/05/2012.  FINDINGS: Segmentation: Normal on the prior CT which is the same numbering system used on the 2016 MRI.  Alignment: Stable lumbar lordosis since 2016. Mild dextroconvex lumbar scoliosis appears increased. No spondylolisthesis.  Vertebrae: Progressed endplate degeneration at L2-L3 since 2016, with mild left far lateral degenerative appearing endplate marrow edema on series 5, image 10. Background bone marrow signal within normal limits. Intact visible sacrum and SI joints. No other acute osseous abnormality identified.  Conus medullaris and cauda equina: Conus extends to the T12 level. No lower spinal cord or conus signal abnormality.  Paraspinal and other soft tissues: Benign left renal midpole cyst. Small benign appearing right renal cortical cysts. Negative visualized posterior paraspinal soft tissues.  Disc levels:  T11-T12: Mild disc bulge, no stenosis.  T12-L1:  Negative.  L1-L2: Minimal disc bulge and posterior element hypertrophy. No stenosis.  L2-L3: Progressed disc desiccation and disc space loss since 2016. Chronic but increased left eccentric circumferential disc bulge with left far lateral component. Mild to moderate posterior element hypertrophy. Mild to moderate left lateral recess stenosis is new (descending left L3 nerve level). Moderate left L2 foraminal stenosis is new. Overall mild spinal stenosis.  L3-L4: Chronic circumferential disc bulge with up to moderate posterior element hypertrophy. Mild epidural lipomatosis. Mild spinal stenosis. Borderline to mild bilateral lateral recess stenosis (L4 nerve levels). Mild to moderate  left and mild right L3 foraminal stenosis. This  level has not significantly changed.  L4-L5: Chronic circumferential disc bulge and endplate spurring eccentric to the right with broad-based posterior component. Moderate posterior element hypertrophy. Mild right lateral recess stenosis. No significant spinal stenosis. Moderate bilateral L4 foraminal stenosis. This level is stable.  L5-S1:  Negative aside from mild facet hypertrophy.  IMPRESSION: 1. Progressed since 2016 L2-L3 disc and endplate degeneration eccentric to the left. New mild spinal stenosis, mild to moderate left lateral recess and moderate left foraminal stenosis. 2. No significant change in chronic L3-L4 and L4-L5 degeneration with up to mild spinal stenosis, mild lateral recess stenosis and moderate foraminal stenosis at each level.   Electronically Signed By: VEAR Hurst M.D. On: 12/07/2018 01:10  Knee-R MR wo contrast: Results for orders placed during the hospital encounter of 10/20/13  MR Knee Right Wo Contrast  Narrative CLINICAL DATA:  Right knee pain for 3 months  EXAM: MRI OF THE RIGHT KNEE WITHOUT CONTRAST  TECHNIQUE: Multiplanar, multisequence MR imaging of the knee was performed. No intravenous contrast was administered.  COMPARISON:  None.  FINDINGS: MENISCI  Medial meniscus: Oblique tear of the posterior horn of the medial meniscus extending to the inferior articular surface.  Lateral meniscus:  Intact.  LIGAMENTS  Cruciates:  Intact ACL and PCL.  Collaterals: Medial collateral ligament is intact. Lateral collateral ligament complex is intact.  CARTILAGE  Patellofemoral:  No chondral defect.  Medial: Cartilage irregularity with partial thickness cartilage loss of the medial femoral condyle and medial tibial plateau.  Lateral:  Cartilage irregularity.  No chondral defect.  Joint: No significant joint effusion. Severe edema in superolateral Hoffa's fat.  Popliteal Fossa:  No joint effusion.  Intact popliteus tendon.  Extensor  Mechanism:  Intact.  Bones: Severe marrow edema in the medial tibial plateau with a subtle subchondral linear signal abnormality in the peripheral aspect of the medial tibial plateau suggesting a nondisplaced subchondral insufficiency fracture. The degree of marrow edema is somewhat out of proportion to the degree of chondral abnormality.  IMPRESSION: 1. Severe marrow edema in the medial tibial plateau with a subtle subchondral linear signal abnormality in the peripheral aspect of the medial tibial plateau suggesting a nondisplaced subchondral insufficiency fracture. The degree of marrow edema is somewhat out of proportion to the degree of chondral abnormality.  2. Oblique tear of the posterior horn of the medial meniscus extending to the inferior articular surface.   Electronically Signed By: Julaine Blanch On: 10/20/2013 10:00  Complexity Note: Imaging results reviewed.                         ROS  Cardiovascular: High blood pressure Pulmonary or Respiratory: No reported pulmonary signs or symptoms such as wheezing and difficulty taking a deep full breath (Asthma), difficulty blowing air out (Emphysema), coughing up mucus (Bronchitis), persistent dry cough, or temporary stoppage of breathing during sleep Neurological: No reported neurological signs or symptoms such as seizures, abnormal skin sensations, urinary and/or fecal incontinence, being born with an abnormal open spine and/or a tethered spinal cord Psychological-Psychiatric: No reported psychological or psychiatric signs or symptoms such as difficulty sleeping, anxiety, depression, delusions or hallucinations (schizophrenial), mood swings (bipolar disorders) or suicidal ideations or attempts Gastrointestinal: No reported gastrointestinal signs or symptoms such as vomiting or evacuating blood, reflux, heartburn, alternating episodes of diarrhea and constipation, inflamed or scarred liver, or pancreas or irrregular and/or  infrequent bowel movements Genitourinary: No reported  renal or genitourinary signs or symptoms such as difficulty voiding or producing urine, peeing blood, non-functioning kidney, kidney stones, difficulty emptying the bladder, difficulty controlling the flow of urine, or chronic kidney disease Hematological: No reported hematological signs or symptoms such as prolonged bleeding, low or poor functioning platelets, bruising or bleeding easily, hereditary bleeding problems, low energy levels due to low hemoglobin or being anemic Endocrine: High blood sugar requiring insulin  (IDDM) Rheumatologic: No reported rheumatological signs and symptoms such as fatigue, joint pain, tenderness, swelling, redness, heat, stiffness, decreased range of motion, with or without associated rash Musculoskeletal: Negative for myasthenia gravis, muscular dystrophy, multiple sclerosis or malignant hyperthermia Work History: Retired  Allergies  Kirk Brown has no known allergies.  Laboratory Chemistry Profile   Renal Lab Results  Component Value Date   BUN 15 10/26/2022   CREATININE 0.78 10/26/2022   GFRAA >60 08/08/2017   GFRNONAA >60 10/26/2022   SPECGRAV 1.025 09/30/2012   PHUR 5.0 09/30/2012   PROTEINUR 30 (A) 10/25/2022     Electrolytes Lab Results  Component Value Date   NA 136 10/26/2022   K 3.6 10/26/2022   CL 99 10/26/2022   CALCIUM  8.7 (L) 10/26/2022     Hepatic Lab Results  Component Value Date   AST 24 10/24/2022   ALT 20 10/24/2022   ALBUMIN 4.8 10/24/2022   ALKPHOS 39 10/24/2022   AMYLASE 33 10/05/2012   LIPASE 15 10/05/2012     ID Lab Results  Component Value Date   STAPHAUREUS NEGATIVE 10/23/2012   MRSAPCR NEGATIVE 10/23/2012     Bone No results found for: VD25OH, CI874NY7UNU, CI6874NY7, CI7874NY7, 25OHVITD1, 25OHVITD2, 25OHVITD3, TESTOFREE, TESTOSTERONE    Endocrine Lab Results  Component Value Date   GLUCOSE 177 (H) 10/26/2022   GLUCOSEU NEGATIVE  10/25/2022   HGBA1C 7.3 (H) 10/25/2022     Neuropathy Lab Results  Component Value Date   HGBA1C 7.3 (H) 10/25/2022     CNS No results found for: COLORCSF, APPEARCSF, RBCCOUNTCSF, WBCCSF, POLYSCSF, LYMPHSCSF, EOSCSF, PROTEINCSF, GLUCCSF, JCVIRUS, CSFOLI, IGGCSF, LABACHR, ACETBL   Inflammation (CRP: Acute  ESR: Chronic) No results found for: CRP, ESRSEDRATE, LATICACIDVEN   Rheumatology Lab Results  Component Value Date   LABURIC 6.0 11/20/2012     Coagulation Lab Results  Component Value Date   INR 1.0 10/24/2022   LABPROT 13.6 10/24/2022   APTT 29 10/24/2022   PLT 178 10/25/2022     Cardiovascular Lab Results  Component Value Date   HGB 13.1 10/25/2022   HCT 39.1 10/25/2022     Screening Lab Results  Component Value Date   STAPHAUREUS NEGATIVE 10/23/2012   MRSAPCR NEGATIVE 10/23/2012     Cancer No results found for: CEA, CA125, LABCA2   Allergens No results found for: ALMOND, APPLE, ASPARAGUS, AVOCADO, BANANA, BARLEY, BASIL, BAYLEAF, GREENBEAN, LIMABEAN, WHITEBEAN, BEEFIGE, REDBEET, BLUEBERRY, BROCCOLI, CABBAGE, MELON, CARROT, CASEIN, CASHEWNUT, CAULIFLOWER, CELERY     Note: Lab results reviewed.  PFSH  Drug: Mr. Furches  reports no history of drug use. Alcohol:  reports current alcohol use of about 3.0 standard drinks of alcohol per week. Tobacco:  reports that he has been smoking cigarettes. He has never used smokeless tobacco. Medical:  has a past medical history of Arthritis, Chronic back pain, Diabetes mellitus without complication (HCC), Gout, Hyperlipemia, Hypertension, Neuropathy, RLS (restless legs syndrome), and Wears glasses. Family: family history includes Alzheimer's disease in his mother; Diabetes in his mother; Hyperlipidemia in his mother; Kidney Stones in his mother and sister.  Past Surgical  History:  Procedure Laterality Date   ANKLE ARTHROSCOPY WITH  ARTHRODESIS  03/12/2012   Procedure: ANKLE ARTHROSCOPY WITH ARTHRODESIS;  Surgeon: Norleen Armor, MD;  Location: [facility];  Service: Orthopedics;  Laterality: Right;  Right Ankle Arthroscopy with Arthrodesis and  Bone Marrow Aspiration Right Hip (BMAC)   CARPAL TUNNEL RELEASE     both hands   COLONOSCOPY     ELBOW ARTHROSCOPY     both   INSERTION OF MESH N/A 11/02/2012   Procedure: INSERTION OF MESH;  Surgeon: Redell Faith, DO;  Location: [facility];  Service: General;  Laterality: N/A;   LAPAROSCOPY N/A 11/02/2012   Procedure: LAPAROSCOPY DIAGNOSTIC  bilateral inguinal hernia ;  Surgeon: Redell Faith, DO;  Location: [facility];  Service: General;  Laterality: N/A;   right fracture      right leg compound fracture-age 5   SHOULDER ARTHROSCOPY     lt   TONSILLECTOMY     UMBILICAL HERNIA REPAIR N/A 11/02/2012   Procedure: HERNIA REPAIR UMBILICAL ADULT;  Surgeon: Redell Faith, DO;  Location: [facility];  Service: General;  Laterality: N/A;   Active Ambulatory Problems    Diagnosis Date Noted   Gout 09/30/2012   HTN (hypertension) 09/30/2012   Other and unspecified hyperlipidemia 09/30/2012   Obesity, unspecified 10/08/2012   Metabolic syndrome 10/08/2012   DM (diabetes mellitus) (HCC) 10/08/2012   Hyperglycemia 10/08/2012   Deep inguinal pain 10/08/2012   Other malaise and fatigue 11/20/2012   Dysphonia on examination 01/11/2019   Complaint related to dreams 01/11/2019   Chronically on opiate therapy 01/11/2019   Former smoker, stopped smoking in distant past 01/11/2019   Benzodiazepine contract exists 01/11/2019   Bilateral foot pain 06/21/2020   Left-sided weakness 10/24/2022   AKI (acute kidney injury) (HCC) 10/25/2022   Chronic painful diabetic neuropathy (HCC) 10/21/2023   Diabetic polyneuropathy associated with type 2 diabetes mellitus (HCC) 10/21/2023   Chronic neuropathic pain 10/21/2023   Chronic pain syndrome 10/21/2023   Resolved Ambulatory Problems    Diagnosis Date  Noted   No Resolved Ambulatory Problems   Past Medical History:  Diagnosis Date   Arthritis    Chronic back pain    Diabetes mellitus without complication (HCC)    Hyperlipemia    Hypertension    Neuropathy    RLS (restless legs syndrome)    Wears glasses    Constitutional Exam  General appearance: Well nourished, well developed, and well hydrated. In no apparent acute distress Vitals:   10/21/23 0900  BP: (!) 161/83  Pulse: 68  Resp: 16  Temp: (!) 97.3 F (36.3 C)  TempSrc: Temporal  SpO2: 99%  Weight: 197 lb (89.4 kg)  Height: 5' 9 (1.753 m)   BMI Assessment: Estimated body mass index is 29.09 kg/m as calculated from the following:   Height as of this encounter: 5' 9 (1.753 m).   Weight as of this encounter: 197 lb (89.4 kg).  BMI interpretation table: BMI level Category Range association with higher incidence of chronic pain  <18 kg/m2 Underweight   18.5-24.9 kg/m2 Ideal body weight   25-29.9 kg/m2 Overweight Increased incidence by 20%  30-34.9 kg/m2 Obese (Class I) Increased incidence by 68%  35-39.9 kg/m2 Severe obesity (Class II) Increased incidence by 136%  >40 kg/m2 Extreme obesity (Class III) Increased incidence by 254%   Patient's current BMI Ideal Body weight  Body mass index is 29.09 kg/m. Ideal body weight: 70.7 kg (155 lb 13.8 oz) Adjusted ideal body weight:  78.2 kg (172 lb 5.1 oz)   BMI Readings from Last 4 Encounters:  10/21/23 29.09 kg/m  09/27/23 27.34 kg/m  06/21/20 28.59 kg/m  01/11/19 29.85 kg/m   Wt Readings from Last 4 Encounters:  10/21/23 197 lb (89.4 kg)  09/27/23 196 lb (88.9 kg)  06/21/20 205 lb (93 kg)  01/11/19 214 lb (97.1 kg)    Psych/Mental status: Alert, oriented x 3 (person, place, & time)       Eyes: PERLA Respiratory: No evidence of acute respiratory distress  Burning and tingling of bilateral feet  Assessment  Primary Diagnosis & Pertinent Problem List: The primary encounter diagnosis was Chronic painful  diabetic neuropathy (HCC). Diagnoses of Diabetic polyneuropathy associated with type 2 diabetes mellitus (HCC), Chronic neuropathic pain, and Chronic pain syndrome were also pertinent to this visit.  Visit Diagnosis (New problems to examiner): 1. Chronic painful diabetic neuropathy (HCC)   2. Diabetic polyneuropathy associated with type 2 diabetes mellitus (HCC)   3. Chronic neuropathic pain   4. Chronic pain syndrome    Plan of Care (Initial workup plan)  Patient with chronic, painful diabetic peripheral neuropathy (DPN) refractory to conservative measures including oral neuropathic agents (e.g., gabapentinoids) and topical therapies. Reports persistent burning, tingling, and dysesthetic pain primarily in bilateral feet, negatively impacting function and quality of life.  Plan:  Recommend initiating Qutenza (capsaicin 8%) topical patch therapy targeting distal lower extremities to reduce localized neuropathic pain. Will perform in clinic under supervision with pre-procedural topical anesthetic.  Discussed potential role for spinal cord stimulation (SCS) as a durable, non-opioid neuromodulatory treatment option for painful diabetic neuropathy. Given severity and refractoriness, patient may be an appropriate candidate.  Will proceed with Qutenza treatment and assess response. If inadequate relief, consider SCS trial.   I explained to pt how a spinal cord stimulation (SCS) is indicated for chronic, intractable pain, especially in cases of failed back surgery syndrome, radiculopathy, or pain syndromes that affect broader areas such as the back and legs.  SCS devices provide continuous electrical impulses to the spinal cord, modulating pain signals before they reach the brain.  We discussed the potential benefits and risks of spinal cord stimulation. Benefits: can provide substantial relief from chronic pain, long-term therapy with programmable settings, often reduces the need for oral medications,  including opioids, some patients experience reduced dependency on other pain interventions. Risks (including but not limited to): Surgical risks, including infection, lead migration/fracture, and hardware malfunction, long-term implantation requires ongoing maintenance and possible reoperation, variable effectiveness: some patients do not experience sufficient pain relief.   Continue routine diabetes management in coordination with PCP.  Follow up in 2-4 weeks post-Qutenza to evaluate efficacy and tolerance.   Procedure Orders         NEUROLYSIS        Provider-requested follow-up: Return in about 6 days (around 10/27/2023) for Qutenza.  Future Appointments  Date Time Provider Department Center  10/27/2023  2:00 PM Marcelino Nurse, MD [facility] None   I discussed the assessment and treatment plan with the patient. The patient was provided an opportunity to ask questions and all were answered. The patient agreed with the plan and demonstrated an understanding of the instructions.  Patient advised to call back or seek an in-person evaluation if the symptoms or condition worsens.  Duration of encounter: .  Total time on encounter, as per AMA guidelines included both the face-to-face and non-face-to-face time personally spent by the physician and/or other qualified health care professional(s) on the day  of the encounter (includes time in activities that require the physician or other qualified health care professional and does not include time in activities normally performed by clinical staff). Physician's time may include the following activities when performed: Preparing to see the patient (e.g., pre-charting review of records, searching for previously ordered imaging, lab work, and nerve conduction tests) Review of prior analgesic pharmacotherapies. Reviewing PMP Interpreting ordered tests (e.g., lab work, imaging, nerve conduction tests) Performing post-procedure evaluations,  including interpretation of diagnostic procedures Obtaining and/or reviewing separately obtained history Performing a medically appropriate examination and/or evaluation Counseling and educating the patient/family/caregiver Ordering medications, tests, or procedures Referring and communicating with other health care professionals (when not separately reported) Documenting clinical information in the electronic or other health record Independently interpreting results (not separately reported) and communicating results to the patient/ family/caregiver Care coordination (not separately reported)  Note by: Wallie Sherry, MD (TTS and AI technology used. I apologize for any typographical errors that were not detected and corrected.) Date: 10/21/2023; Time: 10:00 AM

## 2023-10-21 NOTE — Progress Notes (Signed)
 Safety precautions to be maintained throughout the outpatient stay will include: orient to surroundings, keep bed in low position, maintain call bell within reach at all times, provide assistance with transfer out of bed and ambulation.

## 2023-10-27 ENCOUNTER — Ambulatory Visit
Attending: Student in an Organized Health Care Education/Training Program | Admitting: Student in an Organized Health Care Education/Training Program

## 2023-10-27 ENCOUNTER — Encounter: Payer: Self-pay | Admitting: Student in an Organized Health Care Education/Training Program

## 2023-10-27 VITALS — BP 158/118 | HR 65 | Temp 97.3°F | Resp 16 | Ht 71.0 in | Wt 197.2 lb

## 2023-10-27 DIAGNOSIS — E1142 Type 2 diabetes mellitus with diabetic polyneuropathy: Secondary | ICD-10-CM | POA: Diagnosis not present

## 2023-10-27 DIAGNOSIS — G8929 Other chronic pain: Secondary | ICD-10-CM | POA: Diagnosis not present

## 2023-10-27 DIAGNOSIS — M792 Neuralgia and neuritis, unspecified: Secondary | ICD-10-CM | POA: Diagnosis not present

## 2023-10-27 DIAGNOSIS — E1140 Type 2 diabetes mellitus with diabetic neuropathy, unspecified: Secondary | ICD-10-CM | POA: Insufficient documentation

## 2023-10-27 MED ORDER — CAPSAICIN-CLEANSING GEL 8 % EX KIT
4.0000 | PACK | Freq: Once | CUTANEOUS | Status: AC
  Administered 2023-10-27: 4 via TOPICAL
  Filled 2023-10-27: qty 4

## 2023-10-27 NOTE — Progress Notes (Signed)
 PROVIDER NOTE: Interpretation of information contained herein should be left to medically-trained personnel. Specific patient instructions are provided elsewhere under Patient Instructions section of medical record. This document was created in part using STT-dictation technology, any transcriptional errors that may result from this process are unintentional.  Patient: Kirk Brown Type: Established DOB: Mar 04, 1951 MRN: [id number] PCP: Gerome Brunet, DO  Service: Procedure DOS: 10/27/2023 Setting: Ambulatory Location: Ambulatory outpatient facility Delivery: Face-to-face Provider: Wallie Sherry, MD Specialty: Interventional Pain Management Specialty designation: 09 Location: Outpatient facility Ref. Prov.: Gerome Brunet, DO       Interventional Therapy   Type: Qutenza Neurolysis #1  Laterality:  Bilateral Area treated: Feet Imaging Guidance: None Anesthesia/analgesia/anxiolysis/sedation: None required Medication (Right): Qutenza (capsaicin 8%) topical system Medication (Left): Qutenza (capsaicin 8%) topical system Date: 10/27/2023 Performed by: Wallie Sherry, MD Rationale (medical necessity): procedure needed and proper for the treatment of Kirk Brown's medical symptoms and needs. Indication: Painful diabetic peripheral neuralgia (DPN) (ICD-10-CM:E11.40) severe enough to impact quality of life or function. 1. Chronic painful diabetic neuropathy (HCC)   2. Diabetic polyneuropathy associated with type 2 diabetes mellitus (HCC)   3. Chronic neuropathic pain    NAS-11 Pain score:   Pre-procedure: 5 /10   Post-procedure: 5 /10     Position / Prep / Materials:  Position: Supine  Materials: Qutenza Kit (4 patches)  H&P (Pre-op Assessment):  Kirk Brown is a 73 y.o. (year old), male patient, seen today for interventional treatment. He  has a past surgical history that includes Colonoscopy; Carpal tunnel release; Tonsillectomy; Shoulder arthroscopy; Elbow arthroscopy; Ankle arthroscopy  with arthrodesis (03/12/2012); right fracture ; laparoscopy (N/A, 11/02/2012); Umbilical hernia repair (N/A, 11/02/2012); and Insertion of mesh (N/A, 11/02/2012). Kirk Brown has a current medication list which includes the following prescription(s): allopurinol , aspirin  ec, atorvastatin , clonazepam , fluticasone, gabapentin , glimepiride, metformin , mounjaro, narcan, oxycodone , simvastatin, testosterone  cypionate, and tramadol . His primarily concern today is the Peripheral Neuropathy (bilat)  Initial Vital Signs:  Pulse/HCG Rate: 68  Temp: (!) 97.5 F (36.4 C) Resp: 16 BP: (!) 144/83 SpO2: 100 %  BMI: Estimated body mass index is 27.5 kg/m as calculated from the following:   Height as of this encounter: 5' 11 (1.803 m).   Weight as of this encounter: 197 lb 3.2 oz (89.4 kg).  Risk Assessment: Allergies: Reviewed. He has no known allergies.  Allergy Precautions: None required Coagulopathies: Reviewed. None identified.  Blood-thinner therapy: None at this time Active Infection(s): Reviewed. None identified. Kirk Brown is afebrile  Site Confirmation: Kirk Brown was asked to confirm the procedure and laterality before marking the site Procedure checklist: Completed Consent: Before the procedure and under the influence of no sedative(s), amnesic(s), or anxiolytics, the patient was informed of the treatment options, risks and possible complications. To fulfill our ethical and legal obligations, as recommended by the American Medical Association's Code of Ethics, I have informed the patient of my clinical impression; the nature and purpose of the treatment or procedure; the risks, benefits, and possible complications of the intervention; the alternatives, including doing nothing; the risk(s) and benefit(s) of the alternative treatment(s) or procedure(s); and the risk(s) and benefit(s) of doing nothing. The patient was provided information about the general risks and possible complications associated  with the procedure. These may include, but are not limited to: failure to achieve desired goals, infection, bleeding, organ or nerve damage, allergic reactions, paralysis, and death. In addition, the patient was informed of those risks and complications associated to the procedure, such as failure to decrease  pain; infection; bleeding; organ or nerve damage with subsequent damage to sensory, motor, and/or autonomic systems, resulting in permanent pain, numbness, and/or weakness of one or several areas of the body; allergic reactions; (i.e.: anaphylactic reaction); and/or death. Furthermore, the patient was informed of those risks and complications associated with the medications. These include, but are not limited to: allergic reactions (i.e.: anaphylactic or anaphylactoid reaction(s)); adrenal axis suppression; blood sugar elevation that in diabetics may result in ketoacidosis or comma; water retention that in patients with history of congestive heart failure may result in shortness of breath, pulmonary edema, and decompensation with resultant heart failure; weight gain; swelling or edema; medication-induced neural toxicity; particulate matter embolism and blood vessel occlusion with resultant organ, and/or nervous system infarction; and/or aseptic necrosis of one or more joints. Finally, the patient was informed that Medicine is not an exact science; therefore, there is also the possibility of unforeseen or unpredictable risks and/or possible complications that may result in a catastrophic outcome. The patient indicated having understood very clearly. We have given the patient no guarantees and we have made no promises. Enough time was given to the patient to ask questions, all of which were answered to the patient's satisfaction. Kirk Brown has indicated that he wanted to continue with the procedure. Attestation: I, the ordering provider, attest that I have discussed with the patient the benefits, risks,  side-effects, alternatives, likelihood of achieving goals, and potential problems during recovery for the procedure that I have provided informed consent. Date  Time: 10/27/2023  1:01 PM  Pre-Procedure Preparation:  Monitoring: As per clinic protocol. Respiration, ETCO2, SpO2, BP, heart rate and rhythm monitor placed and checked for adequate function Safety Precautions: Patient was assessed for positional comfort and pressure points before starting the procedure. Time-out: I initiated and conducted the Time-out before starting the procedure, as per protocol. The patient was asked to participate by confirming the accuracy of the Time Out information. Verification of the correct person, site, and procedure were performed and confirmed by me, the nursing staff, and the patient. Time-out conducted as per Joint Commission's Universal Protocol (UP.01.01.01). Time: 1318 Start Time: 1322 hrs.  Description/Narrative of Procedure:          Region: Distal lower extremities Target Area: Sensory peripheral nerves affected by diabetic peripheral neuropathy Site: Feet Approach: Percutaneous  No./Series: Not applicable  Type: Percutaneous  Purpose: Therapeutic  Region: Distal lower extremities  Start Time: 1322 hrs.  Description of the Procedure: Protocol guidelines were followed. The patient was assisted into a comfortable position.  Informed consent was obtained in the patient monitored in the usual manner.  All questions were answered prior to the procedure.  They Qutenza patches were applied to the affected area and then covered with the wrap.  The Patient was kept under observation until the treatment was completed.  The patches were removed and the treated area was inspected.  Vitals:   10/27/23 1306 10/27/23 1310  BP: (!) 144/83 (!) 158/118  Pulse: 68 65  Resp: 16 16  Temp: (!) 97.5 F (36.4 C) (!) 97.3 F (36.3 C)  SpO2: 100% 100%  Weight: 197 lb 3.2 oz (89.4 kg)   Height: 5' 11  (1.803 m)      End Time: 1423 hrs.  Type of Imaging Technique: None used Indication(s): N/A Exposure Time: No patient exposure Contrast: None used. Fluoroscopic Guidance: N/A Ultrasound Guidance: N/A Interpretation: N/A  Post-operative Assessment:  Post-procedure Vital Signs:  Pulse/HCG Rate: 65  Temp: (!) 97.3 F (  36.3 C) Resp: 16 BP: (!) 158/118 SpO2: 100 %  EBL: None  Complications: No immediate post-treatment complications observed by team, or reported by patient.  Note: The patient tolerated the entire procedure well. A repeat set of vitals were taken after the procedure and the patient was kept under observation following institutional policy, for this type of procedure. Post-procedural neurological assessment was performed, showing return to baseline, prior to discharge. The patient was provided with post-procedure discharge instructions, including a section on how to identify potential problems. Should any problems arise concerning this procedure, the patient was given instructions to immediately contact us , at any time, without hesitation. In any case, we plan to contact the patient by telephone for a follow-up status report regarding this interventional procedure.  Comments:  No additional relevant information.  Plan of Care (POC)  Orders:  No orders of the defined types were placed in this encounter.  Medications ordered for procedure: Meds ordered this encounter  Medications   capsaicin topical system 8 % patch 4 patch   Medications administered: We administered capsaicin topical system.  See the medical record for exact dosing, route, and time of administration.   Follow-up plan:   Return in about 7 weeks (around 12/15/2023) for PPE, F2F.     Recent Visits Date Type Provider Dept  10/21/23 Office Visit Marcelino Nurse, MD [facility]  Showing recent visits within past 90 days and meeting all other requirements Today's Visits Date Type Provider  Dept  10/27/23 Procedure visit Marcelino Nurse, MD [facility]  Showing today's visits and meeting all other requirements Future Appointments No visits were found meeting these conditions. Showing future appointments within next 90 days and meeting all other requirements   Disposition: Discharge home  Discharge (Date  Time): 10/27/2023;   hrs.   Primary Care Physician: Gerome Brunet, DO Location: [facility] Note by: Nurse Marcelino, MD (TTS technology used. I apologize for any typographical errors that were not detected and corrected.) Date: 10/27/2023; Time: 2:27 PM  Disclaimer:  Medicine is not an Visual merchandiser. The only guarantee in medicine is that nothing is guaranteed. It is important to note that the decision to proceed with this intervention was based on the information collected from the patient. The Data and conclusions were drawn from the patient's questionnaire, the interview, and the physical examination. Because the information was provided in large part by the patient, it cannot be guaranteed that it has not been purposely or unconsciously manipulated. Every effort has been made to obtain as much relevant data as possible for this evaluation. It is important to note that the conclusions that lead to this procedure are derived in large part from the available data. Always take into account that the treatment will also be dependent on availability of resources and existing treatment guidelines, considered by other Pain Management Practitioners as being common knowledge and practice, at the time of the intervention. For Medico-Legal purposes, it is also important to point out that variation in procedural techniques and pharmacological choices are the acceptable norm. The indications, contraindications, technique, and results of the above procedure should only be interpreted and judged by a Board-Certified Interventional Pain Specialist with extensive  familiarity and expertise in the same exact procedure and technique.

## 2023-10-27 NOTE — Progress Notes (Signed)
 Safety precautions to be maintained throughout the outpatient stay will include: orient to surroundings, keep bed in low position, maintain call bell within reach at all times, provide assistance with transfer out of bed and ambulation.

## 2023-10-28 ENCOUNTER — Telehealth: Payer: Self-pay | Admitting: *Deleted

## 2023-10-28 NOTE — Telephone Encounter (Signed)
 Had extreme burning in feet yesterday, but reports improvement today.

## 2023-11-02 ENCOUNTER — Emergency Department (HOSPITAL_BASED_OUTPATIENT_CLINIC_OR_DEPARTMENT_OTHER)
Admission: EM | Admit: 2023-11-02 | Discharge: 2023-11-02 | Disposition: A | Discharge status: Home / Self Care | Attending: Emergency Medicine | Admitting: Emergency Medicine

## 2023-11-02 ENCOUNTER — Other Ambulatory Visit: Payer: Self-pay

## 2023-11-02 ENCOUNTER — Encounter (HOSPITAL_BASED_OUTPATIENT_CLINIC_OR_DEPARTMENT_OTHER): Payer: Self-pay

## 2023-11-02 DIAGNOSIS — H6123 Impacted cerumen, bilateral: Secondary | ICD-10-CM | POA: Insufficient documentation

## 2023-11-02 DIAGNOSIS — H9192 Unspecified hearing loss, left ear: Secondary | ICD-10-CM | POA: Diagnosis not present

## 2023-11-02 DIAGNOSIS — H6122 Impacted cerumen, left ear: Secondary | ICD-10-CM

## 2023-11-02 DIAGNOSIS — E119 Type 2 diabetes mellitus without complications: Secondary | ICD-10-CM | POA: Insufficient documentation

## 2023-11-02 DIAGNOSIS — H6121 Impacted cerumen, right ear: Secondary | ICD-10-CM

## 2023-11-02 DIAGNOSIS — Z7984 Long term (current) use of oral hypoglycemic drugs: Secondary | ICD-10-CM | POA: Insufficient documentation

## 2023-11-02 DIAGNOSIS — Z79899 Other long term (current) drug therapy: Secondary | ICD-10-CM | POA: Diagnosis not present

## 2023-11-02 DIAGNOSIS — I10 Essential (primary) hypertension: Secondary | ICD-10-CM | POA: Diagnosis not present

## 2023-11-02 DIAGNOSIS — Z7982 Long term (current) use of aspirin: Secondary | ICD-10-CM | POA: Insufficient documentation

## 2023-11-02 NOTE — Discharge Instructions (Signed)
 You had an impaction of earwax in your left ear, and a buildup of earwax in your right ear.  This earwax was removed today.  Please avoid placing anything in your ear such as Q-tips, earbuds as this can cause wax to build up.  Please return to the emergency room for any other new or concerning symptoms.

## 2023-11-02 NOTE — ED Triage Notes (Signed)
 Pt reports increased wax in L ear for a few days. Pt reports decreased hearing in L ear. Pt denies any recent injury or trauma to ear.

## 2023-11-02 NOTE — ED Provider Notes (Signed)
 [place] EMERGENCY DEPARTMENT AT [facility] Provider Note   CSN: [id number] Arrival date & time: 11/02/23  1149     Patient presents with: Ear Fullness   Kirk Brown is a 73 y.o. male with history of diabetes, hypertension, presents with concern for left ear fullness that started out 2 days ago.  He has noticed increased wax in the left ear and has tried to put eardrops in the ear which have not relieved symptoms.  He denies any drainage from the ear.  Denies any ear pain.  Denies any recent injuries to the ear.    Ear Fullness       Prior to Admission medications   Medication Sig Start Date End Date Taking? Authorizing Provider  allopurinol  (ZYLOPRIM ) 300 MG tablet Take 300 mg by mouth daily.    [provider]  aspirin  EC 81 MG tablet Take 1 tablet (81 mg total) by mouth daily. Swallow whole. 10/27/22   Tobie Yetta HERO, MD  atorvastatin  (LIPITOR) 40 MG tablet Take 1 tablet (40 mg total) by mouth daily. 10/27/22   Tobie Yetta HERO, MD  clonazePAM  (KLONOPIN ) 0.5 MG tablet Take 0.5 mg by mouth 2 (two) times daily. 1 tablet, BID and 2 tab at bedtime 12/29/18   [provider]  fluticasone (FLONASE) 50 MCG/ACT nasal spray Place 1 spray into both nostrils daily. 06/16/21   [provider]  gabapentin  (NEURONTIN ) 100 MG capsule Take 1 capsule (100 mg total) by mouth 3 (three) times daily. 09/27/23   Odell Balls, PA-C  glimepiride (AMARYL) 1 MG tablet Take 0.5 mg by mouth daily.    [provider]  metFORMIN  (GLUCOPHAGE ) 500 MG tablet Take 500 mg by mouth 3 (three) times daily.    [provider]  MOUNJARO 2.5 MG/0.5ML Pen SMARTSIG:0.5 Milliliter(s) Once a Week 10/01/23   [provider]  NARCAN 4 MG/0.1ML LIQD nasal spray kit  11/10/18   [provider]  oxyCODONE  (OXY IR/ROXICODONE ) 5 MG immediate release tablet 1 every 8 hours per patient    [provider]  simvastatin (ZOCOR) 20 MG tablet SMARTSIG:1  Tablet(s) By Mouth Every Evening 10/01/23   [provider]  testosterone  cypionate (DEPOTESTOSTERONE CYPIONATE) 200 MG/ML injection Inject 200 mg into the muscle every 14 (fourteen) days. Patient not taking: Reported on 10/27/2023 09/16/22   [provider]  traMADol  (ULTRAM ) 50 MG tablet Take 1 tablet (50 mg total) by mouth every 6 (six) hours as needed for moderate pain (pain score 4-6). Patient not taking: Reported on 10/27/2023 09/27/23   Odell Balls, PA-C    Allergies: Patient has no known allergies.    Review of Systems  Constitutional:  Negative for fever.  HENT:  Negative for ear discharge and ear pain.     Updated Vital Signs BP (!) 163/85 (BP Location: Right Arm)   Pulse 66   Temp (!) 97.3 F (36.3 C)   Resp 16   Ht 5' 11 (1.803 m)   Wt 87.5 kg   SpO2 100%   BMI 26.92 kg/m   Physical Exam Vitals and nursing note reviewed.  Constitutional:      Appearance: Normal appearance.  HENT:     Head: Atraumatic.     Ears:     Comments: TMs bilaterally obscured by cerumen on initial evaluation.  External auditory canal without any erythema or edema.  No drainage from the ears.  No mastoid tenderness palpation   After ear irrigation, TMs visualized bilaterally without any erythema  or alternating.  No TM perforation. Pulmonary:     Effort: Pulmonary effort is normal.  Neurological:     General: No focal deficit present.     Mental Status: He is alert.  Psychiatric:        Mood and Affect: Mood normal.        Behavior: Behavior normal.     (all labs ordered are listed, but only abnormal results are displayed) Labs Reviewed - No data to display  EKG: None  Radiology: No results found.   Ear Cerumen Removal  Date/Time: 11/02/2023 12:40 PM  Performed by: Veta Palma, PA-C Authorized by: Veta Palma, PA-C   Consent:    Consent obtained:  Verbal   Consent given by:  Patient   Risks, benefits, and alternatives were discussed: yes      Risks discussed:  Dizziness, bleeding, incomplete removal and TM perforation   Alternatives discussed:  No treatment Universal protocol:    Procedure explained and questions answered to patient or proxy's satisfaction: yes     Patient identity confirmed:  Verbally with patient Procedure details:    Location:  L ear and R ear   Procedure type: irrigation     Procedure outcomes: cerumen removed   Post-procedure details:    Inspection:  TM intact, ear canal clear and no bleeding   Hearing quality:  Improved   Procedure completion:  Tolerated well, no immediate complications    Medications Ordered in the ED - No data to display                                  Medical Decision Making    Differential diagnosis includes but is not limited to cerumen impaction, otitis media, otitis externa, mastoiditis  ED Course:  Upon initial evaluation, patient is well-appearing, no acute distress.  He has cerumen impaction in the left ear which was irrigated with water and all cerumen removed.  He also had cerumen in the right ear which was irrigated with water and cerumen removed.  Bilateral TM's upon reevaluation without any erythema or bulging.  No TM perforation.  Patient reports the fullness sensation has resolved and he is able to hear much better now.  No signs of ear infection.  Stable appropriate for discharge home.  Impression: Cerumen impaction left ear Cerumen buildup right ear  Disposition:  The patient was discharged home with instructions to avoid putting anything in the ear such as Q-tips or earbuds. Return precautions given.     This chart was dictated using voice recognition software, Dragon. Despite the best efforts of this provider to proofread and correct errors, errors may still occur which can change documentation meaning.       Final diagnoses:  Hearing loss due to cerumen impaction, left  Excessive cerumen in right ear canal    ED Discharge Orders     None           Veta Palma, PA-C 11/02/23 1241    Cottie Donnice PARAS, MD 11/02/23 1414

## 2023-11-04 DIAGNOSIS — M109 Gout, unspecified: Secondary | ICD-10-CM | POA: Diagnosis not present

## 2023-11-04 DIAGNOSIS — E1140 Type 2 diabetes mellitus with diabetic neuropathy, unspecified: Secondary | ICD-10-CM | POA: Diagnosis not present

## 2023-11-04 DIAGNOSIS — E782 Mixed hyperlipidemia: Secondary | ICD-10-CM | POA: Diagnosis not present

## 2023-11-04 DIAGNOSIS — M47816 Spondylosis without myelopathy or radiculopathy, lumbar region: Secondary | ICD-10-CM | POA: Diagnosis not present

## 2023-11-11 DIAGNOSIS — F419 Anxiety disorder, unspecified: Secondary | ICD-10-CM | POA: Diagnosis not present

## 2023-11-11 DIAGNOSIS — E291 Testicular hypofunction: Secondary | ICD-10-CM | POA: Diagnosis not present

## 2023-11-11 DIAGNOSIS — G629 Polyneuropathy, unspecified: Secondary | ICD-10-CM | POA: Diagnosis not present

## 2023-11-11 DIAGNOSIS — E782 Mixed hyperlipidemia: Secondary | ICD-10-CM | POA: Diagnosis not present

## 2023-11-11 DIAGNOSIS — M109 Gout, unspecified: Secondary | ICD-10-CM | POA: Diagnosis not present

## 2023-11-11 DIAGNOSIS — G8929 Other chronic pain: Secondary | ICD-10-CM | POA: Diagnosis not present

## 2023-11-11 DIAGNOSIS — Z0000 Encounter for general adult medical examination without abnormal findings: Secondary | ICD-10-CM | POA: Diagnosis not present

## 2023-11-11 DIAGNOSIS — I10 Essential (primary) hypertension: Secondary | ICD-10-CM | POA: Diagnosis not present

## 2023-11-11 DIAGNOSIS — F331 Major depressive disorder, recurrent, moderate: Secondary | ICD-10-CM | POA: Diagnosis not present

## 2023-11-11 DIAGNOSIS — Z79899 Other long term (current) drug therapy: Secondary | ICD-10-CM | POA: Diagnosis not present

## 2023-11-11 DIAGNOSIS — E1140 Type 2 diabetes mellitus with diabetic neuropathy, unspecified: Secondary | ICD-10-CM | POA: Diagnosis not present

## 2023-11-11 DIAGNOSIS — M4716 Other spondylosis with myelopathy, lumbar region: Secondary | ICD-10-CM | POA: Diagnosis not present

## 2023-12-02 DIAGNOSIS — E1140 Type 2 diabetes mellitus with diabetic neuropathy, unspecified: Secondary | ICD-10-CM | POA: Diagnosis not present

## 2023-12-02 DIAGNOSIS — G8929 Other chronic pain: Secondary | ICD-10-CM | POA: Diagnosis not present

## 2023-12-02 DIAGNOSIS — E291 Testicular hypofunction: Secondary | ICD-10-CM | POA: Diagnosis not present

## 2023-12-02 DIAGNOSIS — E559 Vitamin D deficiency, unspecified: Secondary | ICD-10-CM | POA: Diagnosis not present

## 2023-12-02 DIAGNOSIS — R5383 Other fatigue: Secondary | ICD-10-CM | POA: Diagnosis not present

## 2023-12-02 DIAGNOSIS — G629 Polyneuropathy, unspecified: Secondary | ICD-10-CM | POA: Diagnosis not present

## 2023-12-09 DIAGNOSIS — M47816 Spondylosis without myelopathy or radiculopathy, lumbar region: Secondary | ICD-10-CM | POA: Diagnosis not present

## 2023-12-16 ENCOUNTER — Ambulatory Visit
Attending: Student in an Organized Health Care Education/Training Program | Admitting: Student in an Organized Health Care Education/Training Program

## 2023-12-16 ENCOUNTER — Encounter: Payer: Self-pay | Admitting: Student in an Organized Health Care Education/Training Program

## 2023-12-16 VITALS — BP 135/95 | HR 73 | Temp 98.0°F | Resp 16 | Ht 71.0 in | Wt 194.0 lb

## 2023-12-16 DIAGNOSIS — G894 Chronic pain syndrome: Secondary | ICD-10-CM | POA: Diagnosis not present

## 2023-12-16 DIAGNOSIS — E1142 Type 2 diabetes mellitus with diabetic polyneuropathy: Secondary | ICD-10-CM | POA: Diagnosis not present

## 2023-12-16 DIAGNOSIS — Z7985 Long-term (current) use of injectable non-insulin antidiabetic drugs: Secondary | ICD-10-CM

## 2023-12-16 DIAGNOSIS — E1140 Type 2 diabetes mellitus with diabetic neuropathy, unspecified: Secondary | ICD-10-CM | POA: Diagnosis not present

## 2023-12-16 DIAGNOSIS — G8929 Other chronic pain: Secondary | ICD-10-CM | POA: Insufficient documentation

## 2023-12-16 DIAGNOSIS — M792 Neuralgia and neuritis, unspecified: Secondary | ICD-10-CM | POA: Diagnosis not present

## 2023-12-16 NOTE — Progress Notes (Signed)
 Safety precautions to be maintained throughout the outpatient stay will include: orient to surroundings, keep bed in low position, maintain call bell within reach at all times, provide assistance with transfer out of bed and ambulation.

## 2023-12-16 NOTE — Progress Notes (Signed)
 PROVIDER NOTE: Interpretation of information contained herein should be left to medically-trained personnel. Specific patient instructions are provided elsewhere under Patient Instructions section of medical record. This document was created in part using AI and STT-dictation technology, any transcriptional errors that may result from this process are unintentional.  Patient: Kirk Brown  Service: E/M   PCP: Kirk Brunet, DO  DOB: 1950/06/09  DOS: 12/16/2023  Provider: Wallie Sherry, MD  MRN: [id number]  Delivery: Face-to-face  Specialty: Interventional Pain Management  Type: Established Patient  Setting: Ambulatory outpatient facility  Specialty designation: 09  Referring Prov.: Kirk Brunet, DO  Location: Outpatient office facility       History of present illness (HPI) Kirk Brown, a 73 y.o. year old male, is here today because of his Chronic painful diabetic neuropathy (HCC) [E11.40]. Kirk Brown primary complain today is Peripheral Neuropathy (bilat) and Back Pain   Pain Assessment: Severity of Neuropathic pain is reported as a 4 /10. Location: Foot Right, Left (right foot is worse)/NEW, to thighs bilat. Onset: More than a month ago. Quality: Constant, Tingling, Numbness. Timing: Constant. Modifying factor(s): Qutenza , sometimes walking helps, changing positions. Vitals:  height is 5' 11 (1.803 m) and weight is 194 lb (88 kg). His temperature is 98 F (36.7 C). His blood pressure is 135/95 (abnormal) and his pulse is 73. His respiration is 16 and oxygen saturation is 100%.  BMI: Estimated body mass index is 27.06 kg/m as calculated from the following:   Height as of this encounter: 5' 11 (1.803 m).   Weight as of this encounter: 194 lb (88 kg).  Last encounter: 10/21/2023. Last procedure: 10/27/2023.  Reason for encounter:   Discussed the use of AI scribe software for clinical note transcription with the patient, who gave verbal consent to proceed.  History of Present  Illness   Kirk Brown is a 73 year old male who presents with burning pain following a Qutenza  treatment.  He experienced intense burning pain following a Qutenza  treatment, describing it as feeling like it was 'going to pop through the skin'. This pain lasted for about four to five hours after the treatment and was characterized as hot and burning.  Pain relief was more sustained starting the next day, although he has noticed a slight return of pain over the last couple of days. He estimates experiencing about 50% to 60% pain relief overall.        ROS  Constitutional: Denies any fever or chills Gastrointestinal: No reported hemesis, hematochezia, vomiting, or acute GI distress Musculoskeletal: Denies any acute onset joint swelling, redness, loss of ROM, or weakness Neurological: No reported episodes of acute onset apraxia, aphasia, dysarthria, agnosia, amnesia, paralysis, loss of coordination, or loss of consciousness  Medication Review  allopurinol , aspirin  EC, atorvastatin , clonazePAM , fluticasone, gabapentin , glimepiride, metFORMIN , naloxone, oxyCODONE , simvastatin, testosterone  cypionate, tirzepatide, and traMADol   History Review  Allergy: Kirk Brown has no known allergies. Drug: Kirk Brown  reports no history of drug use. Alcohol:  reports current alcohol use of about 3.0 standard drinks of alcohol per week. Tobacco:  reports that he quit smoking about 34 years ago. His smoking use included cigarettes. He has never used smokeless tobacco. Social: Kirk Brown  reports that he quit smoking about 34 years ago. His smoking use included cigarettes. He has never used smokeless tobacco. He reports current alcohol use of about 3.0 standard drinks of alcohol per week. He reports that he does not use drugs. Medical:  has a past medical history of  Arthritis, Chronic back pain, Diabetes mellitus without complication (HCC), Gout, Hyperlipemia, Hypertension, Neuropathy, RLS (restless legs  syndrome), and Wears glasses. Surgical: Kirk Brown  has a past surgical history that includes Colonoscopy; Carpal tunnel release; Tonsillectomy; Shoulder arthroscopy; Elbow arthroscopy; Ankle arthroscopy with arthrodesis (03/12/2012); right fracture ; laparoscopy (N/A, 11/02/2012); Umbilical hernia repair (N/A, 11/02/2012); and Insertion of mesh (N/A, 11/02/2012). Family: family history includes Alzheimer's disease in his mother; Diabetes in his mother; Hyperlipidemia in his mother; Kidney Stones in his mother and sister.  Laboratory Chemistry Profile   Renal Lab Results  Component Value Date   BUN 15 10/26/2022   CREATININE 0.78 10/26/2022   GFRAA >60 08/08/2017   GFRNONAA >60 10/26/2022    Hepatic Lab Results  Component Value Date   AST 24 10/24/2022   ALT 20 10/24/2022   ALBUMIN 4.8 10/24/2022   ALKPHOS 39 10/24/2022   AMYLASE 33 10/05/2012   LIPASE 15 10/05/2012    Electrolytes Lab Results  Component Value Date   NA 136 10/26/2022   K 3.6 10/26/2022   CL 99 10/26/2022   CALCIUM  8.7 (L) 10/26/2022    Bone No results found for: VD25OH, VD125OH2TOT, CI6874NY7, CI7874NY7, 25OHVITD1, 25OHVITD2, 25OHVITD3, TESTOFREE, TESTOSTERONE   Inflammation (CRP: Acute Phase) (ESR: Chronic Phase) No results found for: CRP, ESRSEDRATE, LATICACIDVEN       Note: Above Lab results reviewed.  Recent Imaging Review  VAS US  CAROTID Carotid Arterial Duplex Study  Patient Name:  Kirk Brown  Date of Exam:   10/25/2022 Medical Rec #: [id number]       Accession #:    [id number] Date of Birth: [birth year]        Patient Gender: M Patient Age:   33 years Exam Location:  [facility] Procedure:      VAS US  CAROTID Referring Phys: Kirk Brown  --------------------------------------------------------------------------------   Indications:  CVA. Risk Factors: Hypertension, hyperlipidemia, Diabetes, current smoker.  Performing Technologist: Vernell Iba RDMS, RVT     Examination Guidelines: A complete evaluation includes B-mode imaging, spectral Doppler, color Doppler, and power Doppler as needed of all accessible portions of each vessel. Bilateral testing is considered an integral part of a complete examination. Limited examinations for reoccurring indications may be performed as noted.    Right Carotid Findings: +----------+--------+--------+--------+------------------+------------------+           PSV cm/sEDV cm/sStenosisPlaque DescriptionComments           +----------+--------+--------+--------+------------------+------------------+ CCA Prox  123     27                                                   +----------+--------+--------+--------+------------------+------------------+ CCA Distal118     38                                                   +----------+--------+--------+--------+------------------+------------------+ ICA Prox  98      29                                intimal thickening +----------+--------+--------+--------+------------------+------------------+ ICA Distal133     52                                                   +----------+--------+--------+--------+------------------+------------------+  ECA       150                                                          +----------+--------+--------+--------+------------------+------------------+  +----------+--------+-------+--------+-------------------+           PSV cm/sEDV cmsDescribeArm Pressure (mmHG) +----------+--------+-------+--------+-------------------+ [id number]            Stenotic                    +----------+--------+-------+--------+-------------------+  +---------+--------+--+--------+--+---------+ VertebralPSV cm/s62EDV cm/s19Antegrade +---------+--------+--+--------+--+---------+     Left Carotid Findings: +----------+--------+--------+--------+-----------------------------+--------+            PSV cm/sEDV cm/sStenosisPlaque Description           Comments +----------+--------+--------+--------+-----------------------------+--------+ CCA Prox  110     29                                                    +----------+--------+--------+--------+-----------------------------+--------+ CCA Distal103     34                                                    +----------+--------+--------+--------+-----------------------------+--------+ ICA Prox  104     39              smooth and hyperechoic , mild         +----------+--------+--------+--------+-----------------------------+--------+ ICA Distal103     36                                                    +----------+--------+--------+--------+-----------------------------+--------+ ECA       120     21                                                    +----------+--------+--------+--------+-----------------------------+--------+  +----------+--------+--------+----------------+-------------------+           PSV cm/sEDV cm/sDescribe        Arm Pressure (mmHG) +----------+--------+--------+----------------+-------------------+ [id number]             Multiphasic, WNL                    +----------+--------+--------+----------------+-------------------+  +---------+--------+--+--------+--+---------+ VertebralPSV cm/s92EDV cm/s28Antegrade +---------+--------+--+--------+--+---------+        Summary: Right Carotid: The extracranial vessels were near-normal with only minimal wall                thickening or plaque.  Left Carotid: The extracranial vessels were near-normal with only minimal wall               thickening or plaque.  Vertebrals:  Bilateral vertebral arteries demonstrate antegrade flow. Subclavians: Right subclavian artery was stenotic. Normal flow hemodynamics were  seen in the left subclavian artery.  *See table(s) above for  measurements and observations.    Electronically signed by Eather Popp MD on 10/27/2022 at 12:47:34 PM.      Final   Note: Reviewed        Physical Exam  Vitals: BP (!) 135/95   Pulse 73   Temp 98 F (36.7 C)   Resp 16   Ht 5' 11 (1.803 m)   Wt 194 lb (88 kg)   SpO2 100%   BMI 27.06 kg/m  BMI: Estimated body mass index is 27.06 kg/m as calculated from the following:   Height as of this encounter: 5' 11 (1.803 m).   Weight as of this encounter: 194 lb (88 kg). Ideal: Ideal body weight: 75.3 kg (166 lb 0.1 oz) Adjusted ideal body weight: 80.4 kg (177 lb 3.3 oz) General appearance: Well nourished, well developed, and well hydrated. In no apparent acute distress Mental status: Alert, oriented x 3 (person, place, & time)       Respiratory: No evidence of acute respiratory distress Eyes: PERLA   Assessment   Diagnosis Status  1. Chronic painful diabetic neuropathy (HCC)   2. Diabetic polyneuropathy associated with type 2 diabetes mellitus (HCC)   3. Chronic neuropathic pain   4. Chronic pain syndrome    Responding Responding Controlled   Updated Problems: No problems updated.  Plan of Care  Assessment and Plan    Peripheral neuropathic pain of the feet   He experiences burning sensation and severe pain lasting four to five hours post-treatment. Initial treatment provided 50-60% pain relief, though some pain has returned recently. The positive initial response suggests continuing therapy every three months for the first year. This low-risk treatment may allow for reduced frequency if the positive response persists. Most patients experience relief for two to three months after the 30-minute procedure. If neuropathy remains stable, treatment frequency may be reduced after the first year. Schedule repeat Qutenza  treatment for early October, three months after the initial session. Evaluate treatment response after the first year to determine if frequency can be reduced.        Orders:  Orders Placed This Encounter  Procedures   NEUROLYSIS    Please order Qutenza  patches    Standing Status:   Future    Expiration Date:   03/17/2024    Where will this procedure be performed?:   [facility]    Return in about 6 weeks (around 01/28/2024) for Qutenza  #2.    Recent Visits Date Type Provider Dept  10/27/23 Procedure visit Marcelino Nurse, MD [facility]  10/21/23 Office Visit Marcelino Nurse, MD [facility]  Showing recent visits within past 90 days and meeting all other requirements Today's Visits Date Type Provider Dept  12/16/23 Office Visit Marcelino Nurse, MD [facility]  Showing today's visits and meeting all other requirements Future Appointments Date Type Provider Dept  01/28/24 Appointment Marcelino Nurse, MD [facility]  Showing future appointments within next 90 days and meeting all other requirements  I discussed the assessment and treatment plan with the patient. The patient was provided an opportunity to ask questions and all were answered. The patient agreed with the plan and demonstrated an understanding of the instructions.  Patient advised to call back or seek an in-person evaluation if the symptoms or condition worsens.  Duration of encounter: .  Total time on encounter, as per AMA guidelines included both the face-to-face and non-face-to-face time personally  spent by the physician and/or other qualified health care professional(s) on the day of the encounter (includes time in activities that require the physician or other qualified health care professional and does not include time in activities normally performed by clinical staff). Physician's time may include the following activities when performed: Preparing to see the patient (e.g., pre-charting review of records, searching for previously ordered imaging, lab work, and nerve conduction tests) Review of prior analgesic  pharmacotherapies. Reviewing PMP Interpreting ordered tests (e.g., lab work, imaging, nerve conduction tests) Performing post-procedure evaluations, including interpretation of diagnostic procedures Obtaining and/or reviewing separately obtained history Performing a medically appropriate examination and/or evaluation Counseling and educating the patient/family/caregiver Ordering medications, tests, or procedures Referring and communicating with other health care professionals (when not separately reported) Documenting clinical information in the electronic or other health record Independently interpreting results (not separately reported) and communicating results to the patient/ family/caregiver Care coordination (not separately reported)  Note by: Kirk Sherry, MD (TTS and AI technology used. I apologize for any typographical errors that were not detected and corrected.) Date: 12/16/2023; Time: 3:20 PM

## 2024-01-07 DIAGNOSIS — G629 Polyneuropathy, unspecified: Secondary | ICD-10-CM | POA: Diagnosis not present

## 2024-01-07 DIAGNOSIS — Z719 Counseling, unspecified: Secondary | ICD-10-CM | POA: Diagnosis not present

## 2024-01-07 DIAGNOSIS — G8929 Other chronic pain: Secondary | ICD-10-CM | POA: Diagnosis not present

## 2024-01-13 DIAGNOSIS — M47816 Spondylosis without myelopathy or radiculopathy, lumbar region: Secondary | ICD-10-CM | POA: Diagnosis not present

## 2024-01-28 ENCOUNTER — Other Ambulatory Visit (HOSPITAL_COMMUNITY): Payer: Self-pay

## 2024-01-28 ENCOUNTER — Ambulatory Visit
Attending: Student in an Organized Health Care Education/Training Program | Admitting: Student in an Organized Health Care Education/Training Program

## 2024-01-28 ENCOUNTER — Encounter: Payer: Self-pay | Admitting: Student in an Organized Health Care Education/Training Program

## 2024-01-28 VITALS — BP 154/90 | HR 63 | Temp 97.3°F | Resp 16 | Ht 71.0 in | Wt 194.0 lb

## 2024-01-28 DIAGNOSIS — M792 Neuralgia and neuritis, unspecified: Secondary | ICD-10-CM | POA: Diagnosis not present

## 2024-01-28 DIAGNOSIS — E1142 Type 2 diabetes mellitus with diabetic polyneuropathy: Secondary | ICD-10-CM | POA: Diagnosis not present

## 2024-01-28 DIAGNOSIS — E1140 Type 2 diabetes mellitus with diabetic neuropathy, unspecified: Secondary | ICD-10-CM | POA: Diagnosis not present

## 2024-01-28 DIAGNOSIS — G8929 Other chronic pain: Secondary | ICD-10-CM | POA: Insufficient documentation

## 2024-01-28 MED ORDER — CAPSAICIN-CLEANSING GEL 8 % EX KIT
4.0000 | PACK | Freq: Once | CUTANEOUS | Status: AC
  Administered 2024-01-28: 1 via TOPICAL
  Filled 2024-01-28: qty 4

## 2024-01-28 NOTE — Progress Notes (Signed)
 PROVIDER NOTE: Interpretation of information contained herein should be left to medically-trained personnel. Specific patient instructions are provided elsewhere under Patient Instructions section of medical record. This document was created in part using STT-dictation technology, any transcriptional errors that may result from this process are unintentional.  Patient: Kirk Brown Type: Established DOB: 06/30/1950 MRN: [id number] PCP: Gerome Brunet, DO  Service: Procedure DOS: 01/28/2024 Setting: Ambulatory Location: Ambulatory outpatient facility Delivery: Face-to-face Provider: Wallie Sherry, MD Specialty: Interventional Pain Management Specialty designation: 09 Location: Outpatient facility Ref. Prov.: Gerome Brunet, DO       Interventional Therapy   Type: Qutenza  Neurolysis #2  Laterality:  Bilateral Area treated: Feet Imaging Guidance: None Anesthesia/analgesia/anxiolysis/sedation: None required Medication (Right): Qutenza  (capsaicin  8%) topical system Medication (Left): Qutenza  (capsaicin  8%) topical system Date: 01/28/2024 Performed by: Wallie Sherry, MD Rationale (medical necessity): procedure needed and proper for the treatment of Kirk Brown's medical symptoms and needs. Indication: Painful diabetic peripheral neuralgia (DPN) (ICD-10-CM:E11.40) severe enough to impact quality of life or function. 1. Chronic painful diabetic neuropathy (HCC)   2. Diabetic polyneuropathy associated with type 2 diabetes mellitus (HCC)   3. Chronic neuropathic pain    NAS-11 Pain score:   Pre-procedure: 3 /10   Post-procedure: 3 /10     Position / Prep / Materials:  Position: Supine  Materials: Qutenza  Kit (4 patches)  H&P (Pre-op Assessment):  Kirk Brown is a 73 y.o. (year old), male patient, seen today for interventional treatment. He  has a past surgical history that includes Colonoscopy; Carpal tunnel release; Tonsillectomy; Shoulder arthroscopy; Elbow arthroscopy; Ankle arthroscopy  with arthrodesis (03/12/2012); right fracture ; laparoscopy (N/A, 11/02/2012); Umbilical hernia repair (N/A, 11/02/2012); and Insertion of mesh (N/A, 11/02/2012). Kirk Brown has a current medication list which includes the following prescription(s): allopurinol , aspirin  ec, atorvastatin , clonazepam , fluticasone, gabapentin , glimepiride, metformin , mounjaro, narcan, oxycodone , simvastatin, testosterone  cypionate, and tramadol . His primarily concern today is the Foot Pain (bilateral)  Initial Vital Signs:  Pulse/HCG Rate: 63  Temp: (!) 97.3 F (36.3 C) Resp: 16 BP: (!) 154/90 SpO2: 100 %  BMI: Estimated body mass index is 27.06 kg/m as calculated from the following:   Height as of this encounter: 5' 11 (1.803 m).   Weight as of this encounter: 194 lb (88 kg).  Risk Assessment: Allergies: Reviewed. He has no known allergies.  Allergy Precautions: None required Coagulopathies: Reviewed. None identified.  Blood-thinner therapy: None at this time Active Infection(s): Reviewed. None identified. Kirk Brown is afebrile  Site Confirmation: Kirk Brown was asked to confirm the procedure and laterality before marking the site Procedure checklist: Completed Consent: Before the procedure and under the influence of no sedative(s), amnesic(s), or anxiolytics, the patient was informed of the treatment options, risks and possible complications. To fulfill our ethical and legal obligations, as recommended by the American Medical Association's Code of Ethics, I have informed the patient of my clinical impression; the nature and purpose of the treatment or procedure; the risks, benefits, and possible complications of the intervention; the alternatives, including doing nothing; the risk(s) and benefit(s) of the alternative treatment(s) or procedure(s); and the risk(s) and benefit(s) of doing nothing. The patient was provided information about the general risks and possible complications associated with the  procedure. These may include, but are not limited to: failure to achieve desired goals, infection, bleeding, organ or nerve damage, allergic reactions, paralysis, and death. In addition, the patient was informed of those risks and complications associated to the procedure, such as failure to decrease pain; infection;  bleeding; organ or nerve damage with subsequent damage to sensory, motor, and/or autonomic systems, resulting in permanent pain, numbness, and/or weakness of one or several areas of the body; allergic reactions; (i.e.: anaphylactic reaction); and/or death. Furthermore, the patient was informed of those risks and complications associated with the medications. These include, but are not limited to: allergic reactions (i.e.: anaphylactic or anaphylactoid reaction(s)); adrenal axis suppression; blood sugar elevation that in diabetics may result in ketoacidosis or comma; water retention that in patients with history of congestive heart failure may result in shortness of breath, pulmonary edema, and decompensation with resultant heart failure; weight gain; swelling or edema; medication-induced neural toxicity; particulate matter embolism and blood vessel occlusion with resultant organ, and/or nervous system infarction; and/or aseptic necrosis of one or more joints. Finally, the patient was informed that Medicine is not an exact science; therefore, there is also the possibility of unforeseen or unpredictable risks and/or possible complications that may result in a catastrophic outcome. The patient indicated having understood very clearly. We have given the patient no guarantees and we have made no promises. Enough time was given to the patient to ask questions, all of which were answered to the patient's satisfaction. Kirk Brown has indicated that he wanted to continue with the procedure. Attestation: I, the ordering provider, attest that I have discussed with the patient the benefits, risks,  side-effects, alternatives, likelihood of achieving goals, and potential problems during recovery for the procedure that I have provided informed consent. Date  Time: 01/28/2024 12:51 PM  Pre-Procedure Preparation:  Monitoring: As per clinic protocol. Respiration, ETCO2, SpO2, BP, heart rate and rhythm monitor placed and checked for adequate function Safety Precautions: Patient was assessed for positional comfort and pressure points before starting the procedure. Time-out: I initiated and conducted the Time-out before starting the procedure, as per protocol. The patient was asked to participate by confirming the accuracy of the Time Out information. Verification of the correct person, site, and procedure were performed and confirmed by me, the nursing staff, and the patient. Time-out conducted as per Joint Commission's Universal Protocol ([email or web address].01.01.01). Time: 1305 Start Time: 1313 hrs.  Description/Narrative of Procedure:          Region: Distal lower extremities Target Area: Sensory peripheral nerves affected by diabetic peripheral neuropathy Site: Feet Approach: Percutaneous  No./Series: Not applicable  Type: Percutaneous  Purpose: Therapeutic  Region: Distal lower extremities  Start Time: 1313 hrs.  Description of the Procedure: Protocol guidelines were followed. The patient was assisted into a comfortable position.  Informed consent was obtained in the patient monitored in the usual manner.  All questions were answered prior to the procedure.  They Qutenza  patches were applied to the affected area and then covered with the wrap.  The Patient was kept under observation until the treatment was completed.  The patches were removed and the treated area was inspected.  Vitals:   01/28/24 1255  BP: (!) 154/90  Pulse: 63  Resp: 16  Temp: (!) 97.3 F (36.3 C)  TempSrc: Temporal  SpO2: 100%  Weight: 194 lb (88 kg)  Height: 5' 11 (1.803 m)     End Time: 1353 hrs.  Type of  Imaging Technique: None used Indication(s): N/A Exposure Time: No patient exposure Contrast: None used. Fluoroscopic Guidance: N/A Ultrasound Guidance: N/A Interpretation: N/A  Post-operative Assessment:  Post-procedure Vital Signs:  Pulse/HCG Rate: 63  Temp: (!) 97.3 F (36.3 C) Resp: 16 BP: (!) 154/90 SpO2: 100 %  EBL: None  Complications: No  immediate post-treatment complications observed by team, or reported by patient.  Note: The patient tolerated the entire procedure well. A repeat set of vitals were taken after the procedure and the patient was kept under observation following institutional policy, for this type of procedure. Post-procedural neurological assessment was performed, showing return to baseline, prior to discharge. The patient was provided with post-procedure discharge instructions, including a section on how to identify potential problems. Should any problems arise concerning this procedure, the patient was given instructions to immediately contact us , at any time, without hesitation. In any case, we plan to contact the patient by telephone for a follow-up status report regarding this interventional procedure.  Comments:  No additional relevant information.  Plan of Care (POC)  Orders:  Orders Placed This Encounter  Procedures   NEUROLYSIS    Please order Qutenza  patches    Standing Status:   Future    Expiration Date:   04/29/2024    Where will this procedure be performed?:   [facility]   Medications ordered for procedure: Meds ordered this encounter  Medications   capsaicin  topical system 8 % patch 4 patch   Medications administered: We administered capsaicin  topical system.  See the medical record for exact dosing, route, and time of administration.   Follow-up plan:   Return in about 3 months (around 05/04/2024) for Qutenza  #3.     Recent Visits Date Type Provider Dept  12/16/23 Office Visit Marcelino Nurse, MD [facility]   Showing recent visits within past 90 days and meeting all other requirements Today's Visits Date Type Provider Dept  01/28/24 Procedure visit Marcelino Nurse, MD [facility]  Showing today's visits and meeting all other requirements Future Appointments No visits were found meeting these conditions. Showing future appointments within next 90 days and meeting all other requirements   Disposition: Discharge home  Discharge (Date  Time): 01/28/2024; 1400 hrs.   Primary Care Physician: Gerome Brunet, DO Location: [facility] Note by: Nurse Marcelino, MD (TTS technology used. I apologize for any typographical errors that were not detected and corrected.) Date: 01/28/2024; Time: 2:14 PM  Disclaimer:  Medicine is not an Visual merchandiser. The only guarantee in medicine is that nothing is guaranteed. It is important to note that the decision to proceed with this intervention was based on the information collected from the patient. The Data and conclusions were drawn from the patient's questionnaire, the interview, and the physical examination. Because the information was provided in large part by the patient, it cannot be guaranteed that it has not been purposely or unconsciously manipulated. Every effort has been made to obtain as much relevant data as possible for this evaluation. It is important to note that the conclusions that lead to this procedure are derived in large part from the available data. Always take into account that the treatment will also be dependent on availability of resources and existing treatment guidelines, considered by other Pain Management Practitioners as being common knowledge and practice, at the time of the intervention. For Medico-Legal purposes, it is also important to point out that variation in procedural techniques and pharmacological choices are the acceptable norm. The indications, contraindications, technique, and results of the above  procedure should only be interpreted and judged by a Board-Certified Interventional Pain Specialist with extensive familiarity and expertise in the same exact procedure and technique.

## 2024-01-29 ENCOUNTER — Telehealth: Payer: Self-pay

## 2024-01-29 NOTE — Telephone Encounter (Signed)
 Post procedure follow up.  Patient states he is doing good.

## 2024-02-10 DIAGNOSIS — Z79899 Other long term (current) drug therapy: Secondary | ICD-10-CM | POA: Diagnosis not present

## 2024-02-10 DIAGNOSIS — E1140 Type 2 diabetes mellitus with diabetic neuropathy, unspecified: Secondary | ICD-10-CM | POA: Diagnosis not present

## 2024-02-10 DIAGNOSIS — G8929 Other chronic pain: Secondary | ICD-10-CM | POA: Diagnosis not present

## 2024-02-17 DIAGNOSIS — M4716 Other spondylosis with myelopathy, lumbar region: Secondary | ICD-10-CM | POA: Diagnosis not present

## 2024-02-17 DIAGNOSIS — E291 Testicular hypofunction: Secondary | ICD-10-CM | POA: Diagnosis not present

## 2024-02-17 DIAGNOSIS — I10 Essential (primary) hypertension: Secondary | ICD-10-CM | POA: Diagnosis not present

## 2024-02-17 DIAGNOSIS — M109 Gout, unspecified: Secondary | ICD-10-CM | POA: Diagnosis not present

## 2024-02-17 DIAGNOSIS — E782 Mixed hyperlipidemia: Secondary | ICD-10-CM | POA: Diagnosis not present

## 2024-02-17 DIAGNOSIS — Z79899 Other long term (current) drug therapy: Secondary | ICD-10-CM | POA: Diagnosis not present

## 2024-02-17 DIAGNOSIS — G8929 Other chronic pain: Secondary | ICD-10-CM | POA: Diagnosis not present

## 2024-02-17 DIAGNOSIS — F331 Major depressive disorder, recurrent, moderate: Secondary | ICD-10-CM | POA: Diagnosis not present

## 2024-02-17 DIAGNOSIS — E1140 Type 2 diabetes mellitus with diabetic neuropathy, unspecified: Secondary | ICD-10-CM | POA: Diagnosis not present

## 2024-02-17 DIAGNOSIS — F419 Anxiety disorder, unspecified: Secondary | ICD-10-CM | POA: Diagnosis not present

## 2024-02-17 DIAGNOSIS — G629 Polyneuropathy, unspecified: Secondary | ICD-10-CM | POA: Diagnosis not present

## 2024-05-05 ENCOUNTER — Ambulatory Visit
Attending: Student in an Organized Health Care Education/Training Program | Admitting: Student in an Organized Health Care Education/Training Program

## 2024-05-05 ENCOUNTER — Encounter: Payer: Self-pay | Admitting: Student in an Organized Health Care Education/Training Program

## 2024-05-05 VITALS — BP 165/99 | HR 63 | Temp 97.7°F | Resp 16 | Ht 71.0 in | Wt 207.0 lb

## 2024-05-05 DIAGNOSIS — G8929 Other chronic pain: Secondary | ICD-10-CM | POA: Insufficient documentation

## 2024-05-05 DIAGNOSIS — M792 Neuralgia and neuritis, unspecified: Secondary | ICD-10-CM | POA: Insufficient documentation

## 2024-05-05 DIAGNOSIS — E1140 Type 2 diabetes mellitus with diabetic neuropathy, unspecified: Secondary | ICD-10-CM | POA: Insufficient documentation

## 2024-05-05 DIAGNOSIS — E1142 Type 2 diabetes mellitus with diabetic polyneuropathy: Secondary | ICD-10-CM | POA: Diagnosis present

## 2024-05-05 MED ORDER — CAPSAICIN-CLEANSING GEL 8 % EX KIT
4.0000 | PACK | Freq: Once | CUTANEOUS | Status: AC
  Administered 2024-05-05: 4 via TOPICAL

## 2024-05-05 NOTE — Progress Notes (Signed)
 PROVIDER NOTE: Interpretation of information contained herein should be left to medically-trained personnel. Specific patient instructions are provided elsewhere under Patient Instructions section of medical record. This document was created in part using STT-dictation technology, any transcriptional errors that may result from this process are unintentional.  Patient: Marney Charm Type: Established DOB: January 27, 1951 MRN: [id number] PCP: Gerome Brunet, DO  Service: Procedure DOS: 05/05/2024 Setting: Ambulatory Location: Ambulatory outpatient facility Delivery: Face-to-face Provider: Wallie Sherry, MD Specialty: Interventional Pain Management Specialty designation: 09 Location: Outpatient facility Ref. Prov.: Gerome Brunet, DO       Interventional Therapy   Type: Qutenza  Neurolysis #3  Laterality:  Bilateral Area treated: Feet Imaging Guidance: None Anesthesia/analgesia/anxiolysis/sedation: None required Medication (Right): Qutenza  (capsaicin  8%) topical system Medication (Left): Qutenza  (capsaicin  8%) topical system Date: 05/05/2024 Performed by: Wallie Sherry, MD Rationale (medical necessity): procedure needed and proper for the treatment of Mr. Loll's medical symptoms and needs. Indication: Painful diabetic peripheral neuralgia (DPN) (ICD-10-CM:E11.40) severe enough to impact quality of life or function. 1. Chronic painful diabetic neuropathy (HCC)   2. Diabetic polyneuropathy associated with type 2 diabetes mellitus (HCC)   3. Chronic neuropathic pain     NAS-11 Pain score:   Pre-procedure: 2 /10   Post-procedure: 2 /10     Position / Prep / Materials:  Position: Supine  Materials: Qutenza  Kit (4 patches)  H&P (Pre-op Assessment):  Mr. Cueto is a 74 y.o. (year old), male patient, seen today for interventional treatment. He  has a past surgical history that includes Colonoscopy; Carpal tunnel release; Tonsillectomy; Shoulder arthroscopy; Elbow arthroscopy; Ankle  arthroscopy with arthrodesis (03/12/2012); right fracture ; laparoscopy (N/A, 11/02/2012); Umbilical hernia repair (N/A, 11/02/2012); and Insertion of mesh (N/A, 11/02/2012). Mr. Vanvoorhis has a current medication list which includes the following prescription(s): allopurinol , aspirin  ec, atorvastatin , clonazepam , fluticasone, gabapentin , glimepiride, metformin , mounjaro, narcan, oxycodone , simvastatin, testosterone  cypionate, and tramadol . His primarily concern today is the Foot Pain (bilateral)  Initial Vital Signs:  Pulse/HCG Rate: 63  Temp: 97.7 F (36.5 C) Resp: 16 BP: (!) 165/99 SpO2: 98 %  BMI: Estimated body mass index is 28.87 kg/m as calculated from the following:   Height as of this encounter: 5' 11 (1.803 m).   Weight as of this encounter: 207 lb (93.9 kg).  Risk Assessment: Allergies: Reviewed. He has no known allergies.  Allergy Precautions: None required Coagulopathies: Reviewed. None identified.  Blood-thinner therapy: None at this time Active Infection(s): Reviewed. None identified. Mr. Bascomb is afebrile  Site Confirmation: Mr. Rehberg was asked to confirm the procedure and laterality before marking the site Procedure checklist: Completed Consent: Before the procedure and under the influence of no sedative(s), amnesic(s), or anxiolytics, the patient was informed of the treatment options, risks and possible complications. To fulfill our ethical and legal obligations, as recommended by the American Medical Association's Code of Ethics, I have informed the patient of my clinical impression; the nature and purpose of the treatment or procedure; the risks, benefits, and possible complications of the intervention; the alternatives, including doing nothing; the risk(s) and benefit(s) of the alternative treatment(s) or procedure(s); and the risk(s) and benefit(s) of doing nothing. The patient was provided information about the general risks and possible complications associated with the  procedure. These may include, but are not limited to: failure to achieve desired goals, infection, bleeding, organ or nerve damage, allergic reactions, paralysis, and death. In addition, the patient was informed of those risks and complications associated to the procedure, such as failure to decrease pain; infection;  bleeding; organ or nerve damage with subsequent damage to sensory, motor, and/or autonomic systems, resulting in permanent pain, numbness, and/or weakness of one or several areas of the body; allergic reactions; (i.e.: anaphylactic reaction); and/or death. Furthermore, the patient was informed of those risks and complications associated with the medications. These include, but are not limited to: allergic reactions (i.e.: anaphylactic or anaphylactoid reaction(s)); adrenal axis suppression; blood sugar elevation that in diabetics may result in ketoacidosis or comma; water retention that in patients with history of congestive heart failure may result in shortness of breath, pulmonary edema, and decompensation with resultant heart failure; weight gain; swelling or edema; medication-induced neural toxicity; particulate matter embolism and blood vessel occlusion with resultant organ, and/or nervous system infarction; and/or aseptic necrosis of one or more joints. Finally, the patient was informed that Medicine is not an exact science; therefore, there is also the possibility of unforeseen or unpredictable risks and/or possible complications that may result in a catastrophic outcome. The patient indicated having understood very clearly. We have given the patient no guarantees and we have made no promises. Enough time was given to the patient to ask questions, all of which were answered to the patient's satisfaction. Mr. Tortorella has indicated that he wanted to continue with the procedure. Attestation: I, the ordering provider, attest that I have discussed with the patient the benefits, risks,  side-effects, alternatives, likelihood of achieving goals, and potential problems during recovery for the procedure that I have provided informed consent. Date  Time: 05/05/2024 12:59 PM  Pre-Procedure Preparation:  Monitoring: As per clinic protocol. Respiration, ETCO2, SpO2, BP, heart rate and rhythm monitor placed and checked for adequate function Safety Precautions: Patient was assessed for positional comfort and pressure points before starting the procedure. Time-out: I initiated and conducted the Time-out before starting the procedure, as per protocol. The patient was asked to participate by confirming the accuracy of the Time Out information. Verification of the correct person, site, and procedure were performed and confirmed by me, the nursing staff, and the patient. Time-out conducted as per Joint Commission's Universal Protocol (UP.01.01.01). Time: 1315 Start Time: 1315 hrs.  Description/Narrative of Procedure:          Region: Distal lower extremities Target Area: Sensory peripheral nerves affected by diabetic peripheral neuropathy Site: Feet Approach: Percutaneous  No./Series: Not applicable  Type: Percutaneous  Purpose: Therapeutic  Region: Distal lower extremities  Start Time: 1315 hrs.  Description of the Procedure: Protocol guidelines were followed. The patient was assisted into a comfortable position.  Informed consent was obtained in the patient monitored in the usual manner.  All questions were answered prior to the procedure.  They Qutenza  patches were applied to the affected area and then covered with the wrap.  The Patient was kept under observation until the treatment was completed.  The patches were removed and the treated area was inspected.  Vitals:   05/05/24 1302  BP: (!) 165/99  Pulse: 63  Resp: 16  Temp: 97.7 F (36.5 C)  TempSrc: Temporal  SpO2: 98%  Weight: 207 lb (93.9 kg)  Height: 5' 11 (1.803 m)      End Time:   hrs.  Type of Imaging  Technique: None used Indication(s): N/A Exposure Time: No patient exposure Contrast: None used. Fluoroscopic Guidance: N/A Ultrasound Guidance: N/A Interpretation: N/A  Post-operative Assessment:  Post-procedure Vital Signs:  Pulse/HCG Rate: 63  Temp: 97.7 F (36.5 C) Resp: 16 BP: (!) 165/99 SpO2: 98 %  EBL: None  Complications: No  immediate post-treatment complications observed by team, or reported by patient.  Note: The patient tolerated the entire procedure well. A repeat set of vitals were taken after the procedure and the patient was kept under observation following institutional policy, for this type of procedure. Post-procedural neurological assessment was performed, showing return to baseline, prior to discharge. The patient was provided with post-procedure discharge instructions, including a section on how to identify potential problems. Should any problems arise concerning this procedure, the patient was given instructions to immediately contact us , at any time, without hesitation. In any case, we plan to contact the patient by telephone for a follow-up status report regarding this interventional procedure.  Comments:  No additional relevant information.  Plan of Care (POC)  Orders:  Orders Placed This Encounter  Procedures   NEUROLYSIS    Please order Qutenza  patches    Standing Status:   Standing    Number of Occurrences:   3    Next Expected Occurrence:   07/23/2024    Expiration Date:   05/05/2025    Where will this procedure be performed?:   [facility]   Medications ordered for procedure: Meds ordered this encounter  Medications   capsaicin  topical system 8 % patch 4 patch   Medications administered: We administered capsaicin  topical system.  See the medical record for exact dosing, route, and time of administration.   Follow-up plan:   Return in about 3 months (around 08/03/2024) for Qutenza  #4.     Recent Visits No visits were found meeting  these conditions. Showing recent visits within past 90 days and meeting all other requirements Today's Visits Date Type Provider Dept  05/05/24 Procedure visit Marcelino Nurse, MD [facility]  Showing today's visits and meeting all other requirements Future Appointments No visits were found meeting these conditions. Showing future appointments within next 90 days and meeting all other requirements   Disposition: Discharge home  Discharge (Date  Time): 05/05/2024; 1400 hrs.   Primary Care Physician: Gerome Brunet, DO Location: [facility] Note by: Nurse Marcelino, MD (TTS technology used. I apologize for any typographical errors that were not detected and corrected.) Date: 05/05/2024; Time: 2:07 PM  Disclaimer:  Medicine is not an visual merchandiser. The only guarantee in medicine is that nothing is guaranteed. It is important to note that the decision to proceed with this intervention was based on the information collected from the patient. The Data and conclusions were drawn from the patient's questionnaire, the interview, and the physical examination. Because the information was provided in large part by the patient, it cannot be guaranteed that it has not been purposely or unconsciously manipulated. Every effort has been made to obtain as much relevant data as possible for this evaluation. It is important to note that the conclusions that lead to this procedure are derived in large part from the available data. Always take into account that the treatment will also be dependent on availability of resources and existing treatment guidelines, considered by other Pain Management Practitioners as being common knowledge and practice, at the time of the intervention. For Medico-Legal purposes, it is also important to point out that variation in procedural techniques and pharmacological choices are the acceptable norm. The indications, contraindications, technique, and  results of the above procedure should only be interpreted and judged by a Board-Certified Interventional Pain Specialist with extensive familiarity and expertise in the same exact procedure and technique.

## 2024-05-06 ENCOUNTER — Telehealth: Payer: Self-pay

## 2024-05-06 NOTE — Telephone Encounter (Signed)
Post procedure follow up.  Patient states he is doing well 

## 2024-08-04 ENCOUNTER — Ambulatory Visit: Admitting: Student in an Organized Health Care Education/Training Program
